# Patient Record
Sex: Female | Born: 1985
Health system: Southern US, Community
[De-identification: ages and names within clinical notes are randomized; demographics above are authoritative.]

## PROBLEM LIST (undated history)

## (undated) DIAGNOSIS — S83209A Unspecified tear of unspecified meniscus, current injury, unspecified knee, initial encounter: Secondary | ICD-10-CM

## (undated) DIAGNOSIS — I82409 Acute embolism and thrombosis of unspecified deep veins of unspecified lower extremity: Secondary | ICD-10-CM

## (undated) DIAGNOSIS — S83519A Sprain of anterior cruciate ligament of unspecified knee, initial encounter: Secondary | ICD-10-CM

## (undated) DIAGNOSIS — O24419 Gestational diabetes mellitus in pregnancy, unspecified control: Secondary | ICD-10-CM

## (undated) DIAGNOSIS — Z789 Other specified health status: Secondary | ICD-10-CM

## (undated) HISTORY — PX: ANTERIOR CRUCIATE LIGAMENT REPAIR: SHX115

## (undated) HISTORY — DX: Unspecified tear of unspecified meniscus, current injury, unspecified knee, initial encounter: S83.209A

## (undated) HISTORY — DX: Sprain of anterior cruciate ligament of unspecified knee, initial encounter: S83.519A

## (undated) HISTORY — PX: MENISCUS REPAIR: SHX5179

## (undated) HISTORY — PX: JOINT REPLACEMENT: SHX530

## (undated) HISTORY — DX: Acute embolism and thrombosis of unspecified deep veins of unspecified lower extremity: I82.409

---
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
## 2011-02-13 LAB — ABO/RH: RH Type: POSITIVE

## 2011-02-13 LAB — HIV ANTIBODY (ROUTINE TESTING W REFLEX): HIV: NONREACTIVE

## 2011-02-13 LAB — HEPATITIS B SURFACE ANTIGEN: Hepatitis B Surface Ag: NEGATIVE

## 2011-02-13 LAB — RUBELLA ANTIBODY, IGM: Rubella: IMMUNE

## 2011-02-13 LAB — RPR: RPR: NONREACTIVE

## 2011-07-07 ENCOUNTER — Encounter (HOSPITAL_COMMUNITY): Payer: Self-pay | Admitting: *Deleted

## 2011-07-07 ENCOUNTER — Inpatient Hospital Stay (HOSPITAL_COMMUNITY)
Admission: AD | Admit: 2011-07-07 | Discharge: 2011-07-09 | DRG: 775 | Disposition: A | Discharge status: Home / Self Care | Payer: Medicaid Other | Source: Ambulatory Visit | Attending: Obstetrics and Gynecology | Admitting: Obstetrics and Gynecology

## 2011-07-07 DIAGNOSIS — O99892 Other specified diseases and conditions complicating childbirth: Secondary | ICD-10-CM | POA: Diagnosis present

## 2011-07-07 DIAGNOSIS — Z3490 Encounter for supervision of normal pregnancy, unspecified, unspecified trimester: Secondary | ICD-10-CM

## 2011-07-07 DIAGNOSIS — Z22330 Carrier of Group B streptococcus: Secondary | ICD-10-CM

## 2011-07-07 HISTORY — DX: Other specified health status: Z78.9

## 2011-07-07 LAB — CBC
Hemoglobin: 12.3 g/dL (ref 12.0–15.0)
MCHC: 33.7 g/dL (ref 30.0–36.0)
Platelets: 201 10*3/uL (ref 150–400)
RBC: 4.08 MIL/uL (ref 3.87–5.11)

## 2011-07-07 LAB — POCT FERN TEST: Fern Test: POSITIVE

## 2011-07-07 LAB — RPR: RPR Ser Ql: NONREACTIVE

## 2011-07-07 MED ORDER — ZOLPIDEM TARTRATE 5 MG PO TABS: 5.0000 mg | ORAL_TABLET | Freq: Every evening | ORAL | Status: DC | PRN

## 2011-07-07 MED ORDER — BENZOCAINE-MENTHOL 20-0.5 % EX AERO
INHALATION_SPRAY | CUTANEOUS | Status: AC
  Administered 2011-07-07: 1 via TOPICAL
  Filled 2011-07-07: qty 56

## 2011-07-07 MED ORDER — FLEET ENEMA 7-19 GM/118ML RE ENEM: 1.0000 | ENEMA | RECTAL | Status: DC | PRN

## 2011-07-07 MED ORDER — TETANUS-DIPHTH-ACELL PERTUSSIS 5-2.5-18.5 LF-MCG/0.5 IM SUSP: 0.5000 mL | Freq: Once | INTRAMUSCULAR | Status: DC

## 2011-07-07 MED ORDER — CITRIC ACID-SODIUM CITRATE 334-500 MG/5ML PO SOLN: 30.0000 mL | ORAL | Status: DC | PRN

## 2011-07-07 MED ORDER — LACTATED RINGERS IV SOLN: 500.0000 mL | INTRAVENOUS | Status: DC | PRN

## 2011-07-07 MED ORDER — IBUPROFEN 600 MG PO TABS
600.0000 mg | ORAL_TABLET | Freq: Four times a day (QID) | ORAL | Status: DC
  Administered 2011-07-07 – 2011-07-09 (×10): 600 mg via ORAL
  Filled 2011-07-07 (×9): qty 1

## 2011-07-07 MED ORDER — OXYCODONE-ACETAMINOPHEN 5-325 MG PO TABS: 2.0000 | ORAL_TABLET | ORAL | Status: DC | PRN

## 2011-07-07 MED ORDER — LACTATED RINGERS IV SOLN
INTRAVENOUS | Status: DC
  Administered 2011-07-07: 03:00:00 via INTRAVENOUS

## 2011-07-07 MED ORDER — IBUPROFEN 600 MG PO TABS: 600.0000 mg | ORAL_TABLET | Freq: Four times a day (QID) | ORAL | Status: DC | PRN

## 2011-07-07 MED ORDER — SIMETHICONE 80 MG PO CHEW: 80.0000 mg | CHEWABLE_TABLET | ORAL | Status: DC | PRN

## 2011-07-07 MED ORDER — OXYTOCIN BOLUS FROM INFUSION: 500.0000 mL | Freq: Once | INTRAVENOUS | Status: DC

## 2011-07-07 MED ORDER — OXYCODONE-ACETAMINOPHEN 5-325 MG PO TABS
1.0000 | ORAL_TABLET | ORAL | Status: DC | PRN
  Administered 2011-07-08: 1 via ORAL
  Filled 2011-07-07: qty 1

## 2011-07-07 MED ORDER — ONDANSETRON HCL 4 MG PO TABS: 4.0000 mg | ORAL_TABLET | ORAL | Status: DC | PRN

## 2011-07-07 MED ORDER — ONDANSETRON HCL 4 MG/2ML IJ SOLN: 4.0000 mg | Freq: Four times a day (QID) | INTRAMUSCULAR | Status: DC | PRN

## 2011-07-07 MED ORDER — METHYLERGONOVINE MALEATE 0.2 MG/ML IJ SOLN: 0.2000 mg | INTRAMUSCULAR | Status: DC | PRN

## 2011-07-07 MED ORDER — METHYLERGONOVINE MALEATE 0.2 MG PO TABS: 0.2000 mg | ORAL_TABLET | ORAL | Status: DC | PRN

## 2011-07-07 MED ORDER — ONDANSETRON HCL 4 MG/2ML IJ SOLN: 4.0000 mg | INTRAMUSCULAR | Status: DC | PRN

## 2011-07-07 MED ORDER — WITCH HAZEL-GLYCERIN EX PADS: 1.0000 "application " | MEDICATED_PAD | CUTANEOUS | Status: DC | PRN

## 2011-07-07 MED ORDER — PRENATAL PLUS 27-1 MG PO TABS
1.0000 | ORAL_TABLET | Freq: Every day | ORAL | Status: DC
  Administered 2011-07-07 – 2011-07-09 (×3): 1 via ORAL
  Filled 2011-07-07 (×3): qty 1

## 2011-07-07 MED ORDER — DIBUCAINE 1 % RE OINT: 1.0000 "application " | TOPICAL_OINTMENT | RECTAL | Status: DC | PRN

## 2011-07-07 MED ORDER — DIPHENHYDRAMINE HCL 25 MG PO CAPS: 25.0000 mg | ORAL_CAPSULE | Freq: Four times a day (QID) | ORAL | Status: DC | PRN

## 2011-07-07 MED ORDER — NALBUPHINE SYRINGE 5 MG/0.5 ML
5.0000 mg | INJECTION | INTRAMUSCULAR | Status: DC | PRN
  Administered 2011-07-07: 5 mg via INTRAVENOUS
  Filled 2011-07-07: qty 0.5

## 2011-07-07 MED ORDER — OXYTOCIN 20 UNITS IN LACTATED RINGERS INFUSION - SIMPLE: 125.0000 mL/h | Freq: Once | INTRAVENOUS | Status: DC

## 2011-07-07 MED ORDER — SENNOSIDES-DOCUSATE SODIUM 8.6-50 MG PO TABS
2.0000 | ORAL_TABLET | Freq: Every day | ORAL | Status: DC
  Administered 2011-07-07 – 2011-07-08 (×2): 2 via ORAL

## 2011-07-07 MED ORDER — BENZOCAINE-MENTHOL 20-0.5 % EX AERO
1.0000 "application " | INHALATION_SPRAY | CUTANEOUS | Status: DC | PRN
  Administered 2011-07-07: 1 via TOPICAL

## 2011-07-07 MED ORDER — ACETAMINOPHEN 325 MG PO TABS: 650.0000 mg | ORAL_TABLET | ORAL | Status: DC | PRN

## 2011-07-07 MED ORDER — LANOLIN HYDROUS EX OINT: TOPICAL_OINTMENT | CUTANEOUS | Status: DC | PRN

## 2011-07-07 MED ORDER — LIDOCAINE HCL (PF) 1 % IJ SOLN: 30.0000 mL | INTRAMUSCULAR | Status: DC | PRN

## 2011-07-07 NOTE — Progress Notes (Signed)
  Delivery Note Pt progressed rapidly upon arrival to birthing suites.  At 4:59 AM a viable female was delivered via Vaginal, Spontaneous Delivery (Presentation: Right Occiput Anterior), precipitously.  No nuchal cord noted.  No difficulty with shoulders.   APGAR: 8, 9; weight 5 lb 3.4 oz (2365 g).   Placenta status:Intact , .  Cord: 3 vessels with the following complications: .  Cord pH: N/A  Anesthesia: None  Episiotomy: None Lacerations: Labial;1st degree Suture Repair: hemostatic and no repair necessary. Est. Blood Loss (mL):   Mom to postpartum.  Baby to nursery-stable.  Leoni Goodness O. 07/07/2011, 7:49 AM

## 2011-07-07 NOTE — Progress Notes (Signed)
Called and spoke with Elsie Ra CNM re no PNR available, no labs. System is down and unable to look up in spectrum at this time.

## 2011-07-07 NOTE — Progress Notes (Signed)
Elsie Ra CNM at bedside. EFM strip reviewed. Trans adj

## 2011-07-07 NOTE — H&P (Signed)
Kathleen Mason is a 24 y.o. female G2 P1,0,0,1 at 39.0wks presenting for regular uterine contractions which began earlier in the day and have increased in intensity and frequency. Denies ROM or bldg.  Active fetus.   Maternal Medical History:  Reason for admission: Reason for admission: contractions.  Contractions: Onset was 3-5 hours ago.   Frequency: regular.   Duration is approximately 60 seconds.   Perceived severity is strong.    Fetal activity: Perceived fetal activity is normal.   Last perceived fetal movement was within the past hour.    Prenatal complications: no prenatal complications Prenatal Complications - Diabetes: none.   Pt states she has been followed by the CNM service.  Pt's prenatal records are not available in Epic and no labs are avail in [facility] system at the present time. Pt denies any pregnancy complications or significant OB hx.  Pt states her GBS status is negative.   OB History    Grav Para Term Preterm Abortions TAB SAB Ect Mult Living   2 1 1       1      Past Medical History  Diagnosis Date  . No pertinent past medical history    Past Surgical History  Procedure Date  . Gallbladder surgery 2004   Family History: family history includes Diabetes in her paternal grandmother. Social History:  reports that she has never smoked. She does not have any smokeless tobacco history on file. She reports that she drinks alcohol. She reports that she does not use illicit drugs.  Review of Systems  Constitutional: Negative.   HENT: Negative.   Eyes: Negative.   Respiratory: Negative.   Cardiovascular: Negative.   Gastrointestinal: Negative.   Genitourinary: Negative.   Musculoskeletal: Negative.   Skin: Negative.   Neurological: Negative.   Endo/Heme/Allergies: Negative.   Psychiatric/Behavioral: Negative.       Blood pressure 133/84, pulse 76, temperature 98.4 F (36.9 C), temperature source Oral, resp. rate 20, height 5\' 5"  (1.651 m), weight 88.055 kg  (194 lb 2 oz). Maternal Exam:  Uterine Assessment: Contraction strength is firm.  Contraction duration is 70 seconds. Contraction frequency is regular.   Abdomen: Patient reports no abdominal tenderness. Fundal height is 38.   Estimated fetal weight is 6#.   Fetal presentation: vertex  Introitus: Normal vulva. Normal vagina.  Ferning test: not done.  Nitrazine test: not done. Amniotic fluid character: not assessed.  Pelvis: adequate for delivery.   Cervix: Cervix evaluated by digital exam.     Fetal Exam Fetal Monitor Review: Mode: ultrasound.   Baseline rate: 140.  Variability: minimal (<5 bpm).   Pattern: accelerations present and no accelerations.    Fetal State Assessment: Category II - tracings are indeterminate.    SVE 3cm/70%/-1  Physical Exam  Constitutional: She is oriented to person, place, and time. She appears well-developed and well-nourished.  HENT:  Head: Normocephalic and atraumatic.  Right Ear: External ear normal.  Left Ear: External ear normal.  Nose: Nose normal.  Eyes: Conjunctivae are normal. Pupils are equal, round, and reactive to light.  Neck: Normal range of motion. Neck supple. No thyromegaly present.  Cardiovascular: Normal rate, regular rhythm and intact distal pulses.   Respiratory: Effort normal and breath sounds normal.  GI: Soft. Bowel sounds are normal.  Genitourinary: Vagina normal and uterus normal.  Musculoskeletal: Normal range of motion.  Neurological: She is alert and oriented to person, place, and time. She has normal reflexes.  Skin: Skin is warm and dry.  Psychiatric: She has a normal mood and affect. Her behavior is normal. Judgment and thought content normal.    Prenatal labs: Unavailable at the present time ABO, Rh:   Antibody:   Rubella:   RPR:    HBsAg:    HIV:    GBS:     Assessment/Plan: IUP at 39wks. Early labor    Josefine Fuhr O. 07/07/2011, 2:27 AM

## 2011-07-07 NOTE — Progress Notes (Signed)
Pt is progressing quickly, getting ready for delivery

## 2011-07-07 NOTE — ED Notes (Signed)
Elsie Ra CNM called and will put in order for pain med for pt.

## 2011-07-07 NOTE — Progress Notes (Addendum)
Kathleen Mason is a 25 y.o. G2P2002 at [redacted]w[redacted]d. Subjective: Called to see pt due to variable decels on FHR tracing.  Also pt has had SROM in MAU of small amt light mec fluid while awaiting transfer to birthing suites.  Pt reports increased intensity of UCs.  Desires epidural placement.    Objective: BP 117/70  Pulse 71  Temp(Src) 98 F (36.7 C) (Oral)  Resp 20  Ht 5\' 5"  (1.651 m)  Wt 88.055 kg (194 lb 2 oz)  BMI 32.30 kg/m2  SpO2 100%  Breastfeeding? Unknown      FHT:  FHR: 130 bpm, variability: minimal ,  accelerations:  Present,  decelerations:  Present variable decels with UCs.   UC:   regular, every 2-3 minutes SVE:   4cm/90%/-1 per RN.   Labs: Lab Results  Component Value Date   WBC 14.1* 07/07/2011   HGB 12.3 07/07/2011   HCT 36.5 07/07/2011   MCV 89.5 07/07/2011   PLT 201 07/07/2011    Assessment / Plan: Spontaneous labor, progressing normally Meconium stained amniotic fluid  Labor: Progressing normally Preeclampsia:  no signs or symptoms of toxicity Fetal Wellbeing:  Category II Pain Control:  Labor support without medications I/D:  n/a Anticipated MOD:  NSVD and    Await bed in birthing suites.  Continue to observe FHR closely.  Dr. Stefano Mason updated.    Kathleen Mason O. 07/07/2011, 7:42 AM

## 2011-07-07 NOTE — ED Notes (Signed)
Conni Elliot CNM notified of pt's status. Aware of minimal variability, variables with ctxs, pt to L Lat. And O2 on at 10l/min. To reck cervix before giving Nubain.

## 2011-07-07 NOTE — Progress Notes (Signed)
Pt states, " I've had contractions off and on all day, but more regular since noon. They are every 4-5 minutes now."

## 2011-07-07 NOTE — Progress Notes (Signed)
SVD of viable baby boy

## 2011-07-07 NOTE — ED Notes (Signed)
Report called to [facility] in [place].

## 2011-07-08 LAB — CBC
HCT: 32.8 % — ABNORMAL LOW (ref 36.0–46.0)
Hemoglobin: 10.7 g/dL — ABNORMAL LOW (ref 12.0–15.0)
MCV: 90.6 fL (ref 78.0–100.0)
Platelets: 180 10*3/uL (ref 150–400)
RDW: 14.1 % (ref 11.5–15.5)
WBC: 11.1 10*3/uL — ABNORMAL HIGH (ref 4.0–10.5)

## 2011-07-08 NOTE — Progress Notes (Signed)
Post Partum Day 1 Subjective: no complaints.  Doing well.  Plans Micronor for contraception.  Breastfeeding.  Objective: Blood pressure 103/69, pulse 66, temperature 98 F (36.7 C), temperature source Oral, resp. rate 18, height 5\' 5"  (1.651 m), weight 88.055 kg (194 lb 2 oz), SpO2 98.00%, unknown if currently breastfeeding.  Physical Exam:  General: alert Lochia: appropriate Uterine Fundus: firm Incision: healing well DVT Evaluation: No evidence of DVT seen on physical exam. Negative Homan's sign.   Basename 07/08/11 0516 07/07/11 0259  HGB 10.7* 12.3  HCT 32.8* 36.5    Assessment/Plan: Plan for discharge tomorrow Will Rx Micronor.   LOS: 1 day   Nigel Bridgeman 07/08/2011, 12:58 PM

## 2011-07-08 NOTE — Progress Notes (Signed)
UR chart review completed.  

## 2011-07-09 DIAGNOSIS — Z3490 Encounter for supervision of normal pregnancy, unspecified, unspecified trimester: Secondary | ICD-10-CM

## 2011-07-09 MED ORDER — NORETHINDRONE 0.35 MG PO TABS: 1.0000 | ORAL_TABLET | Freq: Every day | ORAL | Status: DC

## 2011-07-09 MED ORDER — OXYCODONE-ACETAMINOPHEN 5-325 MG PO TABS: 1.0000 | ORAL_TABLET | ORAL | Status: AC | PRN

## 2011-07-09 MED ORDER — IBUPROFEN 600 MG PO TABS: 600.0000 mg | ORAL_TABLET | Freq: Four times a day (QID) | ORAL | Status: AC | PRN

## 2011-07-09 NOTE — Discharge Summary (Signed)
   Obstetric Discharge Summary Reason for Admission: onset of labor Prenatal Procedures: ultrasound Intrapartum Procedures: spontaneous vaginal delivery Postpartum Procedures: none Complications-Operative and Postpartum: 1st degree perineal laceration  Temp:  [97.4 F (36.3 C)-97.8 F (36.6 C)] 97.7 F (36.5 C) (11/06 0540) Pulse Rate:  [62-71] 62  (11/06 0540) Resp:  [18] 18  (11/06 0540) BP: (112-119)/(76-81) 112/76 mmHg (11/06 0540) Hemoglobin  Date Value Range Status  07/08/2011 10.7* 12.0-15.0 (g/dL) Final     HCT  Date Value Range Status  07/08/2011 32.8* 36.0-46.0 (%) Final    Hospital Course:  [facility] Course: Admitted in labor 07/07/11. Positive GBS. Progressed to fully dilated quickly, without pain medications.  Delivery was performed by Elsie Ra, CNM, without difficulty. Patient and baby tolerated the procedure well, with a 1st degree laceration noted. Infant to FTN. Mother and infant then had an uncomplicated postpartum course, with breast feeding going well. Mom's physical exam was WNL, and she was discharged home in stable condition. Contraception plan was Micronor.  She received adequate benefit from po pain medications.  Other issues:  None      Discharge Diagnoses: Term Pregnancy-delivered  Discharge Information: Date: 07/09/2011 Activity: Per [facility] handout Diet: routine Medications:  Medication List  As of 07/09/2011  9:01 AM   START taking these medications         norethindrone 0.35 MG tablet   Commonly known as: MICRONOR,CAMILA,ERRIN   Take 1 tablet (0.35 mg total) by mouth daily.      oxyCODONE-acetaminophen 5-325 MG per tablet   Commonly known as: PERCOCET   Take 1-2 tablets by mouth every 3 (three) hours as needed (moderate - severe pain).         CHANGE how you take these medications         ibuprofen 600 MG tablet   Commonly known as: ADVIL,MOTRIN   Take 1 tablet (600 mg total) by mouth every 6 (six) hours as needed for pain.   What  changed: - medication strength - dose - reasons to take the med - doctor's instructions         CONTINUE taking these medications         prenatal vitamin w/FE, FA 27-1 MG Tabs          Where to get your medications    These are the prescriptions that you need to pick up.   You may get these medications from any pharmacy.         ibuprofen 600 MG tablet   norethindrone 0.35 MG tablet   oxyCODONE-acetaminophen 5-325 MG per tablet           Condition: stable Instructions: refer to practice specific booklet Discharge to: home Follow-up Information    Follow up with [facility] in 6 weeks.         Newborn Data: Live born  Information for the patient's newborn:  Mariann, Palo [[id number]]  female ; APGAR 8/9 , ; weight 5+3;  Home with mother.  Cataleia Gade 07/09/2011, 9:01 AM

## 2014-07-04 ENCOUNTER — Encounter (HOSPITAL_COMMUNITY): Payer: Self-pay | Admitting: *Deleted

## 2015-06-22 ENCOUNTER — Other Ambulatory Visit: Payer: Self-pay | Admitting: Obstetrics and Gynecology

## 2015-06-22 DIAGNOSIS — N6312 Unspecified lump in the right breast, upper inner quadrant: Secondary | ICD-10-CM

## 2015-07-03 ENCOUNTER — Ambulatory Visit
Admission: RE | Admit: 2015-07-03 | Discharge: 2015-07-03 | Disposition: A | Discharge status: Home / Self Care | Payer: BLUE CROSS/BLUE SHIELD | Source: Ambulatory Visit | Attending: Obstetrics and Gynecology | Admitting: Obstetrics and Gynecology

## 2015-07-03 ENCOUNTER — Other Ambulatory Visit: Payer: Self-pay | Admitting: Obstetrics and Gynecology

## 2015-07-03 DIAGNOSIS — N6312 Unspecified lump in the right breast, upper inner quadrant: Secondary | ICD-10-CM

## 2015-08-10 ENCOUNTER — Other Ambulatory Visit: Payer: Self-pay | Admitting: Orthopedic Surgery

## 2015-08-10 DIAGNOSIS — M25532 Pain in left wrist: Secondary | ICD-10-CM

## 2015-08-24 ENCOUNTER — Other Ambulatory Visit: Payer: BLUE CROSS/BLUE SHIELD

## 2015-09-07 ENCOUNTER — Ambulatory Visit
Admission: RE | Admit: 2015-09-07 | Discharge: 2015-09-07 | Disposition: A | Discharge status: Home / Self Care | Payer: 59 | Source: Ambulatory Visit | Attending: Orthopedic Surgery | Admitting: Orthopedic Surgery

## 2015-09-07 ENCOUNTER — Ambulatory Visit
Admission: RE | Admit: 2015-09-07 | Discharge: 2015-09-07 | Disposition: A | Discharge status: Home / Self Care | Payer: BLUE CROSS/BLUE SHIELD | Source: Ambulatory Visit | Attending: Orthopedic Surgery | Admitting: Orthopedic Surgery

## 2015-09-07 DIAGNOSIS — M25532 Pain in left wrist: Secondary | ICD-10-CM

## 2015-09-07 MED ORDER — IOHEXOL 180 MG/ML  SOLN
1.5000 mL | Freq: Once | INTRAMUSCULAR | Status: AC | PRN
  Administered 2015-09-07: 1.5 mL via INTRA_ARTICULAR

## 2016-05-27 ENCOUNTER — Encounter: Payer: Self-pay | Admitting: Obstetrics and Gynecology

## 2016-05-27 ENCOUNTER — Ambulatory Visit (INDEPENDENT_AMBULATORY_CARE_PROVIDER_SITE_OTHER): Payer: 59 | Admitting: Obstetrics and Gynecology

## 2016-05-27 VITALS — BP 102/60 | HR 60 | Resp 13 | Ht 65.0 in | Wt 187.0 lb

## 2016-05-27 DIAGNOSIS — E663 Overweight: Secondary | ICD-10-CM | POA: Diagnosis not present

## 2016-05-27 DIAGNOSIS — Z3009 Encounter for other general counseling and advice on contraception: Secondary | ICD-10-CM

## 2016-05-27 DIAGNOSIS — Z124 Encounter for screening for malignant neoplasm of cervix: Secondary | ICD-10-CM

## 2016-05-27 DIAGNOSIS — Z0000 Encounter for general adult medical examination without abnormal findings: Secondary | ICD-10-CM | POA: Diagnosis not present

## 2016-05-27 DIAGNOSIS — Z01419 Encounter for gynecological examination (general) (routine) without abnormal findings: Secondary | ICD-10-CM | POA: Diagnosis not present

## 2016-05-27 LAB — COMPREHENSIVE METABOLIC PANEL
ALBUMIN: 4.2 g/dL (ref 3.6–5.1)
ALT: 30 U/L — ABNORMAL HIGH (ref 6–29)
AST: 16 U/L (ref 10–30)
Alkaline Phosphatase: 47 U/L (ref 33–115)
BILIRUBIN TOTAL: 0.5 mg/dL (ref 0.2–1.2)
BUN: 13 mg/dL (ref 7–25)
CALCIUM: 9.1 mg/dL (ref 8.6–10.2)
CHLORIDE: 104 mmol/L (ref 98–110)
CO2: 22 mmol/L (ref 20–31)
Creat: 0.73 mg/dL (ref 0.50–1.10)
Glucose, Bld: 84 mg/dL (ref 65–99)
Potassium: 4 mmol/L (ref 3.5–5.3)
Sodium: 135 mmol/L (ref 135–146)
Total Protein: 7.3 g/dL (ref 6.1–8.1)

## 2016-05-27 LAB — LIPID PANEL
CHOLESTEROL: 120 mg/dL — AB (ref 125–200)
HDL: 40 mg/dL — AB (ref >=46)
LDL CALC: 53 mg/dL (ref <130)
TRIGLYCERIDES: 134 mg/dL (ref <150)
Total CHOL/HDL Ratio: 3 Ratio (ref <=5.0)
VLDL: 27 mg/dL (ref <30)

## 2016-05-27 MED ORDER — LEVONORGESTREL-ETHINYL ESTRAD 0.1-20 MG-MCG PO TABS: 1.0000 | ORAL_TABLET | Freq: Every day | ORAL | 3 refills | Status: DC

## 2016-05-27 NOTE — Progress Notes (Signed)
30 y.o. [id number] MarriedHispanicF here for annual exam.  She is on the mini-pill, was called in as an error, should have been Levora.  Period Cycle (Days): 28 Period Duration (Days): 3-4 days  Period Pattern: Regular Menstrual Flow: Moderate Menstrual Control: Tampon Dysmenorrhea: (!) Moderate Dysmenorrhea Symptoms: Cramping  Saturates a regular tampon in 8 hours. No BTB. Cramps are mostly tolerable, helped with aleve. No dyspareunia.  She exercises for 30-60 minutes 3-4 days a week. She is trying to eat healthy.   Patient's last menstrual period was 05/16/2016 (approximate).          Sexually active: Yes.    The current method of family planning is OCP.    Exercising: Yes.    soccer,weight lifting Smoker:  no  Health Maintenance: Pap:  06/2015 WNL History of abnormal Pap:  no MMG:  07-03-15 WNL  Colonoscopy:  Never BMD:   Never TDaP:  01/2016  Gardasil: no   reports that she has never smoked. She has never used smokeless tobacco. She reports that she drinks about 2.4 - 3.0 oz of alcohol per week . She reports that she does not use drugs.Works as a Museum/gallery conservator. Two sons, 4 and 8.   Past Medical History:  Diagnosis Date  . No pertinent past medical history     Past Surgical History:  Procedure Laterality Date  . GALLBLADDER SURGERY  2004    Current Outpatient Prescriptions  Medication Sig Dispense Refill  . norethindrone (ORTHO MICRONOR) 0.35 MG tablet Take 1 tablet (0.35 mg total) by mouth daily. 1 Package 11   No current facility-administered medications for this visit.     Family History  Problem Relation Age of Onset  . Diabetes Paternal Grandmother     Review of Systems  Constitutional: Negative.   HENT: Negative.   Eyes: Negative.   Respiratory: Negative.   Cardiovascular: Negative.   Gastrointestinal: Negative.   Endocrine: Negative.   Genitourinary: Negative.   Musculoskeletal: Negative.   Skin: Negative.   Allergic/Immunologic: Negative.    Neurological: Negative.   Psychiatric/Behavioral: Negative.   Occasionally feels she can't get a deep breath.  Exam:   BP 102/60 (BP Location: Right Arm, Patient Position: Sitting, Cuff Size: Normal)   Pulse 60   Resp 13   Ht 5\' 5"  (1.651 m)   Wt 187 lb (84.8 kg)   LMP 05/16/2016 (Approximate)   Breastfeeding? No   BMI 31.12 kg/m   Weight change: @WEIGHTCHANGE @ Height:   Height: 5\' 5"  (165.1 cm)  Ht Readings from Last 3 Encounters:  05/27/16 5\' 5"  (1.651 m)  07/07/11 5\' 5"  (1.651 m)    General appearance: alert, cooperative and appears stated age Head: Normocephalic, without obvious abnormality, atraumatic Neck: no adenopathy, supple, symmetrical, trachea midline and thyroid normal to inspection and palpation Lungs: clear to auscultation bilaterally Breasts: normal appearance, no masses or tenderness Heart: regular rate and rhythm Abdomen: soft, non-tender; bowel sounds normal; no masses,  no organomegaly Extremities: extremities normal, atraumatic, no cyanosis or edema Skin: Skin color, texture, turgor normal. No rashes or lesions Lymph nodes: Cervical, supraclavicular, and axillary nodes normal. No abnormal inguinal nodes palpated Neurologic: Grossly normal   Pelvic: External genitalia:  no lesions              Urethra:  normal appearing urethra with no masses, tenderness or lesions              Bartholins and Skenes: normal  Vagina: normal appearing vagina with normal color and discharge, no lesions              Cervix: no lesions               Bimanual Exam:  Uterus:  normal size, contour, position, consistency, mobility, non-tender              Adnexa: no mass, fullness, tenderness               Rectovaginal: Confirms               Anus:  normal sphincter tone, no lesions  Chaperone was present for exam.  A:  Well Woman with normal exam  Contraception, will go back on combination OCP's, no contraindications, risks reviewed  Can't loose weight even  with exercise and diet  Occasionally episodes of feeling like she can't get a deep breath, normal exam, will f/u with primary (names given)    P:   Pap with hpv  Screening labs  TSH  Restart on OCP's  Discussed breast self exam  Discussed calcium and vit D intake  Discussed weight watchers and exercise

## 2016-05-27 NOTE — Patient Instructions (Addendum)
Breast Self-Awareness Practicing breast self-awareness may pick up problems early, prevent significant medical complications, and possibly save your life. By practicing breast self-awareness, you can become familiar with how your breasts look and feel and if your breasts are changing. This allows you to notice changes early. It can also offer you some reassurance that your breast health is good. One way to learn what is normal for your breasts and whether your breasts are changing is to do a breast self-exam. If you find a lump or something that was not present in the past, it is best to contact your caregiver right away. Other findings that should be evaluated by your caregiver include nipple discharge, especially if it is bloody; skin changes or reddening; areas where the skin seems to be pulled in (retracted); or new lumps and bumps. Breast pain is seldom associated with cancer (malignancy), but should also be evaluated by a caregiver. HOW TO PERFORM A BREAST SELF-EXAM The best time to examine your breasts is 5-7 days after your menstrual period is over. During menstruation, the breasts are lumpier, and it may be more difficult to pick up changes. If you do not menstruate, have reached menopause, or had your uterus removed (hysterectomy), you should examine your breasts at regular intervals, such as monthly. If you are breastfeeding, examine your breasts after a feeding or after using a breast pump. Breast implants do not decrease the risk for lumps or tumors, so continue to perform breast self-exams as recommended. Talk to your caregiver about how to determine the difference between the implant and breast tissue. Also, talk about the amount of pressure you should use during the exam. Over time, you will become more familiar with the variations of your breasts and more comfortable with the exam. A breast self-exam requires you to remove all your clothes above the waist. 1. Look at your breasts and nipples.  Stand in front of a mirror in a room with good lighting. With your hands on your hips, push your hands firmly downward. Look for a difference in shape, contour, and size from one breast to the other (asymmetry). Asymmetry includes puckers, dips, or bumps. Also, look for skin changes, such as reddened or scaly areas on the breasts. Look for nipple changes, such as discharge, dimpling, repositioning, or redness. 2. Carefully feel your breasts. This is best done either in the shower or tub while using soapy water or when flat on your back. Place the arm (on the side of the breast you are examining) above your head. Use the pads (not the fingertips) of your three middle fingers on your opposite hand to feel your breasts. Start in the underarm area and use  inch (2 cm) overlapping circles to feel your breast. Use 3 different levels of pressure (light, medium, and firm pressure) at each circle before moving to the next circle. The light pressure is needed to feel the tissue closest to the skin. The medium pressure will help to feel breast tissue a little deeper, while the firm pressure is needed to feel the tissue close to the ribs. Continue the overlapping circles, moving downward over the breast until you feel your ribs below your breast. Then, move one finger-width towards the center of the body. Continue to use the  inch (2 cm) overlapping circles to feel your breast as you move slowly up toward the collar bone (clavicle) near the base of the neck. Continue the up and down exam using all 3 pressures until you reach the   middle of the chest. Do this with each breast, carefully feeling for lumps or changes. 3.  Keep a written record with breast changes or normal findings for each breast. By writing this information down, you do not need to depend only on memory for size, tenderness, or location. Write down where you are in your menstrual cycle, if you are still menstruating. Breast tissue can have some lumps or  thick tissue. However, see your caregiver if you find anything that concerns you.  SEEK MEDICAL CARE IF:  You see a change in shape, contour, or size of your breasts or nipples.   You see skin changes, such as reddened or scaly areas on the breasts or nipples.   You have an unusual discharge from your nipples.   You feel a new lump or unusually thick areas.    This information is not intended to replace advice given to you by your health care provider. Make sure you discuss any questions you have with your health care provider.   Document Released: 08/19/2005 Document Revised: 08/05/2012 Document Reviewed: 12/04/2011 Elsevier Interactive Patient Education 2016 [employer]. Oral Contraception Information Oral contraceptive pills (OCPs) are medicines taken to prevent pregnancy. OCPs work by preventing the ovaries from releasing eggs. The hormones in OCPs also cause the cervical mucus to thicken, preventing the sperm from entering the uterus. The hormones also cause the uterine lining to become thin, not allowing a fertilized egg to attach to the inside of the uterus. OCPs are highly effective when taken exactly as prescribed. However, OCPs do not prevent sexually transmitted diseases (STDs). Safe sex practices, such as using condoms along with the pill, can help prevent STDs.  Before taking the pill, you may have a physical exam and Pap test. Your health care provider may order blood tests. The health care provider will make sure you are a good candidate for oral contraception. Discuss with your health care provider the possible side effects of the OCP you may be prescribed. When starting an OCP, it can take 2 to 3 months for the body to adjust to the changes in hormone levels in your body.  TYPES OF ORAL CONTRACEPTION  The combination pill--This pill contains estrogen and progestin (synthetic progesterone) hormones. The combination pill comes in 21-day, 28-day, or 91-day packs. Some types  of combination pills are meant to be taken continuously (365-day pills). With 21-day packs, you do not take pills for 7 days after the last pill. With 28-day packs, the pill is taken every day. The last 7 pills are without hormones. Certain types of pills have more than 21 hormone-containing pills. With 91-day packs, the first 84 pills contain both hormones, and the last 7 pills contain no hormones or contain estrogen only.  The minipill--This pill contains the progesterone hormone only. The pill is taken every day continuously. It is very important to take the pill at the same time each day. The minipill comes in packs of 28 pills. All 28 pills contain the hormone.  ADVANTAGES OF ORAL CONTRACEPTIVE PILLS  Decreases premenstrual symptoms.   Treats menstrual period cramps.   Regulates the menstrual cycle.   Decreases a heavy menstrual flow.   May treatacne, depending on the type of pill.   Treats abnormal uterine bleeding.   Treats polycystic ovarian syndrome.   Treats endometriosis.   Can be used as emergency contraception.  THINGS THAT CAN MAKE ORAL CONTRACEPTIVE PILLS LESS EFFECTIVE OCPs can be less effective if:   You forget to take  the pill at the same time every day.   You have a stomach or intestinal disease that lessens the absorption of the pill.   You take OCPs with other medicines that make OCPs less effective, such as antibiotics, certain HIV medicines, and some seizure medicines.   You take expired OCPs.   You forget to restart the pill on day 7, when using the packs of 21 pills.  RISKS ASSOCIATED WITH ORAL CONTRACEPTIVE PILLS  Oral contraceptive pills can sometimes cause side effects, such as:  Headache.  Nausea.  Breast tenderness.  Irregular bleeding or spotting. Combination pills are also associated with a small increased risk of:  Blood clots.  Heart attack.  Stroke.   This information is not intended to replace advice given to you  by your health care provider. Make sure you discuss any questions you have with your health care provider.   Document Released: 11/09/2002 Document Revised: 06/09/2013 Document Reviewed: 02/07/2013 Elsevier Interactive Patient Education 2016 [employer]  EXERCISE AND DIET:  We recommended that you start or continue a regular exercise program for good health. Regular exercise means any activity that makes your heart beat faster and makes you sweat.  We recommend exercising at least 30 minutes per day at least 3 days a week, preferably 4 or 5.  We also recommend a diet low in fat and sugar.  Inactivity, poor dietary choices and obesity can cause diabetes, heart attack, stroke, and kidney damage, among others.    ALCOHOL AND SMOKING:  Women should limit their alcohol intake to no more than 7 drinks/beers/glasses of wine (combined, not each!) per week. Moderation of alcohol intake to this level decreases your risk of breast cancer and liver damage. And of course, no recreational drugs are part of a healthy lifestyle.  And absolutely no smoking or even second hand smoke. Most people know smoking can cause heart and lung diseases, but did you know it also contributes to weakening of your bones? Aging of your skin?  Yellowing of your teeth and nails?  CALCIUM AND VITAMIN D:  Adequate intake of calcium and Vitamin D are recommended.  The recommendations for exact amounts of these supplements seem to change often, but generally speaking 600 mg of calcium (either carbonate or citrate) and 800 units of Vitamin D per day seems prudent. Certain women may benefit from higher intake of Vitamin D.  If you are among these women, your doctor will have told you during your visit.    PAP SMEARS:  Pap smears, to check for cervical cancer or precancers,  have traditionally been done yearly, although recent scientific advances have shown that most women can have pap smears less often.  However, every woman still should have  a physical exam from her gynecologist every year. It will include a breast check, inspection of the vulva and vagina to check for abnormal growths or skin changes, a visual exam of the cervix, and then an exam to evaluate the size and shape of the uterus and ovaries.  And after 32 years of age, a rectal exam is indicated to check for rectal cancers. We will also provide age appropriate advice regarding health maintenance, like when you should have certain vaccines, screening for sexually transmitted diseases, bone density testing, colonoscopy, mammograms, etc.   MAMMOGRAMS:  All women over 59 years old should have a yearly mammogram. Many facilities now offer a "3D" mammogram, which may cost around $50 extra out of pocket. If possible,  we recommend you  accept the option to have the 3D mammogram performed.  It both reduces the number of women who will be called back for extra views which then turn out to be normal, and it is better than the routine mammogram at detecting truly abnormal areas.    COLONOSCOPY:  Colonoscopy to screen for colon cancer is recommended for all women at age 32.  We know, you hate the idea of the prep.  We agree, BUT, having colon cancer and not knowing it is worse!!  Colon cancer so often starts as a polyp that can be seen and removed at colonscopy, which can quite literally save your life!  And if your first colonoscopy is normal and you have no family history of colon cancer, most women don't have to have it again for 10 years.  Once every ten years, you can do something that may end up saving your life, right?  We will be happy to help you get it scheduled when you are ready.  Be sure to check your insurance coverage so you understand how much it will cost.  It may be covered as a preventative service at no cost, but you should check your particular policy.

## 2016-05-28 ENCOUNTER — Telehealth: Payer: Self-pay | Admitting: *Deleted

## 2016-05-28 LAB — CBC
HEMATOCRIT: 40.2 % (ref 35.0–45.0)
HEMOGLOBIN: 12.9 g/dL (ref 11.7–15.5)
MCH: 29.9 pg (ref 27.0–33.0)
MCHC: 32.1 g/dL (ref 32.0–36.0)
MCV: 93.1 fL (ref 80.0–100.0)
MPV: 10.8 fL (ref 7.5–12.5)
Platelets: 286 10*3/uL (ref 140–400)
RBC: 4.32 MIL/uL (ref 3.80–5.10)
RDW: 13.8 % (ref 11.0–15.0)
WBC: 8.8 10*3/uL (ref 3.8–10.8)

## 2016-05-28 LAB — TSH: TSH: 3.54 mIU/L

## 2016-05-28 LAB — VITAMIN D 25 HYDROXY (VIT D DEFICIENCY, FRACTURES): Vit D, 25-Hydroxy: 24 ng/mL — ABNORMAL LOW (ref 30–100)

## 2016-05-28 NOTE — Telephone Encounter (Signed)
-----   Message from Romualdo BolkJill Evelyn Jertson, MD sent at 05/28/2016 10:07 AM EDT ----- Please let the patient know that her vit D was low. She should start on vit D 3, 1,000 IU a day (long term). One of her LFT's was just above normal. Her good cholesterol (HCL) is low, this increases her risk of heart disease down the line. She should eat a diet low in saturated fats (I think she is already trying to do this) and try and increase her exercise to 60 minutes 5 days a week.  She is establishing care with a primary and should have her LFT's checked again there. Please ask her to call us once she sets up an appointment and we can send a copy of her labs.  It's important her LFT's get followed. If her LFT's were to go up combination OCP's wouldn't be the best option for her. Please keep her chart so we don't loose track of her. I want to make sure she follow's up.

## 2016-05-28 NOTE — Telephone Encounter (Signed)
Left message to call regarding lab results -eh 

## 2016-05-29 LAB — IPS PAP TEST WITH HPV

## 2016-05-29 NOTE — Telephone Encounter (Signed)
Left another message for patient to return call-eh 

## 2016-05-29 NOTE — Telephone Encounter (Signed)
Patient is returning a call to Elaine. °

## 2016-05-30 ENCOUNTER — Telehealth: Payer: Self-pay | Admitting: *Deleted

## 2016-05-30 NOTE — Telephone Encounter (Signed)
Left message to call regarding PAP results -eh

## 2016-05-30 NOTE — Telephone Encounter (Signed)
Spoke with patient and gave results. See results note -eh

## 2016-05-30 NOTE — Telephone Encounter (Signed)
-----   Message from Romualdo BolkJill Evelyn Jertson, MD sent at 05/29/2016  2:51 PM EDT ----- CBC is normal Pap normal with negative HPV, + for BV. If symptomatic only, would treat for Bv Oral: Flagyl 500 mg BID x 7 days, or Vaginal: Metrogel, 1 applicator per vagina q day x 5 days. No ETOH on flagyl 02 recall

## 2016-05-30 NOTE — Telephone Encounter (Signed)
Patient returning call.

## 2016-07-24 ENCOUNTER — Encounter: Payer: Self-pay | Admitting: *Deleted

## 2017-06-05 ENCOUNTER — Encounter: Payer: Self-pay | Admitting: Obstetrics and Gynecology

## 2017-06-05 ENCOUNTER — Ambulatory Visit (INDEPENDENT_AMBULATORY_CARE_PROVIDER_SITE_OTHER): Payer: 59 | Admitting: Obstetrics and Gynecology

## 2017-06-05 VITALS — BP 102/60 | HR 60 | Resp 14 | Ht 65.0 in | Wt 205.0 lb

## 2017-06-05 DIAGNOSIS — E559 Vitamin D deficiency, unspecified: Secondary | ICD-10-CM | POA: Insufficient documentation

## 2017-06-05 DIAGNOSIS — Z01419 Encounter for gynecological examination (general) (routine) without abnormal findings: Secondary | ICD-10-CM | POA: Diagnosis not present

## 2017-06-05 DIAGNOSIS — Z3041 Encounter for surveillance of contraceptive pills: Secondary | ICD-10-CM

## 2017-06-05 DIAGNOSIS — R635 Abnormal weight gain: Secondary | ICD-10-CM | POA: Diagnosis not present

## 2017-06-05 DIAGNOSIS — R748 Abnormal levels of other serum enzymes: Secondary | ICD-10-CM

## 2017-06-05 DIAGNOSIS — Z0000 Encounter for general adult medical examination without abnormal findings: Secondary | ICD-10-CM

## 2017-06-05 DIAGNOSIS — E663 Overweight: Secondary | ICD-10-CM

## 2017-06-05 DIAGNOSIS — R945 Abnormal results of liver function studies: Secondary | ICD-10-CM | POA: Diagnosis not present

## 2017-06-05 DIAGNOSIS — R7989 Other specified abnormal findings of blood chemistry: Secondary | ICD-10-CM

## 2017-06-05 MED ORDER — LEVONORGESTREL-ETHINYL ESTRAD 0.1-20 MG-MCG PO TABS: 1.0000 | ORAL_TABLET | Freq: Every day | ORAL | 3 refills | Status: DC

## 2017-06-05 NOTE — Patient Instructions (Signed)
EXERCISE AND DIET:  We recommended that you start or continue a regular exercise program for good health. Regular exercise means any activity that makes your heart beat faster and makes you sweat.  We recommend exercising at least 30 minutes per day at least 3 days a week, preferably 4 or 5.  We also recommend a diet low in fat and sugar.  Inactivity, poor dietary choices and obesity can cause diabetes, heart attack, stroke, and kidney damage, among others.    ALCOHOL AND SMOKING:  Women should limit their alcohol intake to no more than 7 drinks/beers/glasses of wine (combined, not each!) per week. Moderation of alcohol intake to this level decreases your risk of breast cancer and liver damage. And of course, no recreational drugs are part of a healthy lifestyle.  And absolutely no smoking or even second hand smoke. Most people know smoking can cause heart and lung diseases, but did you know it also contributes to weakening of your bones? Aging of your skin?  Yellowing of your teeth and nails?  CALCIUM AND VITAMIN D:  Adequate intake of calcium and Vitamin D are recommended.  The recommendations for exact amounts of these supplements seem to change often, but generally speaking 600 mg of calcium (either carbonate or citrate) and 800 units of Vitamin D per day seems prudent. Certain women may benefit from higher intake of Vitamin D.  If you are among these women, your doctor will have told you during your visit.    PAP SMEARS:  Pap smears, to check for cervical cancer or precancers,  have traditionally been done yearly, although recent scientific advances have shown that most women can have pap smears less often.  However, every woman still should have a physical exam from her gynecologist every year. It will include a breast check, inspection of the vulva and vagina to check for abnormal growths or skin changes, a visual exam of the cervix, and then an exam to evaluate the size and shape of the uterus and  ovaries.  And after 33 years of age, a rectal exam is indicated to check for rectal cancers. We will also provide age appropriate advice regarding health maintenance, like when you should have certain vaccines, screening for sexually transmitted diseases, bone density testing, colonoscopy, mammograms, etc.   MAMMOGRAMS:  All women over 40 years old should have a yearly mammogram. Many facilities now offer a "3D" mammogram, which may cost around $50 extra out of pocket. If possible,  we recommend you accept the option to have the 3D mammogram performed.  It both reduces the number of women who will be called back for extra views which then turn out to be normal, and it is better than the routine mammogram at detecting truly abnormal areas.    COLONOSCOPY:  Colonoscopy to screen for colon cancer is recommended for all women at age 50.  We know, you hate the idea of the prep.  We agree, BUT, having colon cancer and not knowing it is worse!!  Colon cancer so often starts as a polyp that can be seen and removed at colonscopy, which can quite literally save your life!  And if your first colonoscopy is normal and you have no family history of colon cancer, most women don't have to have it again for 10 years.  Once every ten years, you can do something that may end up saving your life, right?  We will be happy to help you get it scheduled when you are ready.    Be sure to check your insurance coverage so you understand how much it will cost.  It may be covered as a preventative service at no cost, but you should check your particular policy.      Breast Self-Awareness Breast self-awareness means being familiar with how your breasts look and feel. It involves checking your breasts regularly and reporting any changes to your health care provider. Practicing breast self-awareness is important. A change in your breasts can be a sign of a serious medical problem. Being familiar with how your breasts look and feel allows  you to find any problems early, when treatment is more likely to be successful. All women should practice breast self-awareness, including women who have had breast implants. How to do a breast self-exam One way to learn what is normal for your breasts and whether your breasts are changing is to do a breast self-exam. To do a breast self-exam: Look for Changes  1. Remove all the clothing above your waist. 2. Stand in front of a mirror in a room with good lighting. 3. Put your hands on your hips. 4. Push your hands firmly downward. 5. Compare your breasts in the mirror. Look for differences between them (asymmetry), such as: ? Differences in shape. ? Differences in size. ? Puckers, dips, and bumps in one breast and not the other. 6. Look at each breast for changes in your skin, such as: ? Redness. ? Scaly areas. 7. Look for changes in your nipples, such as: ? Discharge. ? Bleeding. ? Dimpling. ? Redness. ? A change in position. Feel for Changes  Carefully feel your breasts for lumps and changes. It is best to do this while lying on your back on the floor and again while sitting or standing in the shower or tub with soapy water on your skin. Feel each breast in the following way:  Place the arm on the side of the breast you are examining above your head.  Feel your breast with the other hand.  Start in the nipple area and make  inch (2 cm) overlapping circles to feel your breast. Use the pads of your three middle fingers to do this. Apply light pressure, then medium pressure, then firm pressure. The light pressure will allow you to feel the tissue closest to the skin. The medium pressure will allow you to feel the tissue that is a little deeper. The firm pressure will allow you to feel the tissue close to the ribs.  Continue the overlapping circles, moving downward over the breast until you feel your ribs below your breast.  Move one finger-width toward the center of the body.  Continue to use the  inch (2 cm) overlapping circles to feel your breast as you move slowly up toward your collarbone.  Continue the up and down exam using all three pressures until you reach your armpit.  Write Down What You Find  Write down what is normal for each breast and any changes that you find. Keep a written record with breast changes or normal findings for each breast. By writing this information down, you do not need to depend only on memory for size, tenderness, or location. Write down where you are in your menstrual cycle, if you are still menstruating. If you are having trouble noticing differences in your breasts, do not get discouraged. With time you will become more familiar with the variations in your breasts and more comfortable with the exam. How often should I examine my breasts? Examine   your breasts every month. If you are breastfeeding, the best time to examine your breasts is after a feeding or after using a breast pump. If you menstruate, the best time to examine your breasts is 5-7 days after your period is over. During your period, your breasts are lumpier, and it may be more difficult to notice changes. When should I see my health care provider? See your health care provider if you notice:  A change in shape or size of your breasts or nipples.  A change in the skin of your breast or nipples, such as a reddened or scaly area.  Unusual discharge from your nipples.  A lump or thick area that was not there before.  Pain in your breasts.  Anything that concerns you.  This information is not intended to replace advice given to you by your health care provider. Make sure you discuss any questions you have with your health care provider. Document Released: 08/19/2005 Document Revised: 01/25/2016 Document Reviewed: 07/09/2015 Elsevier Interactive Patient Education  2018 [employer]  

## 2017-06-05 NOTE — Progress Notes (Signed)
31 y.o. [id number] MarriedHispanicF here for annual exam.   She is on OCP's, cycles about every other month. Bleeds x 3-4 days, light. Occasional cramping, tolerable.  Sexually active, no pain.   Patient's last menstrual period was 06/03/2017.          Sexually active: Yes.    The current method of family planning is OCP (estrogen/progesterone).    Exercising: Yes.    gym  Smoker:  no  Health Maintenance: Pap:  05-27-16 WNL NEG HR HPV  History of abnormal Pap:  no MMG:  07-03-15 WNL  Colonoscopy:  Never BMD:   Never TDaP:  09-27-15 Gardasil: no    reports that she has never smoked. She has never used smokeless tobacco. She reports that she drinks about 2.4 - 3.0 oz of alcohol per week . She reports that she does not use drugs. Works as a Museum/gallery conservator. Two sons, 5 and 9.   Past Medical History:  Diagnosis Date  . No pertinent past medical history     Past Surgical History:  Procedure Laterality Date  . GALLBLADDER SURGERY  2004    Current Outpatient Prescriptions  Medication Sig Dispense Refill  . levonorgestrel-ethinyl estradiol (AVIANE,ALESSE,LESSINA) 0.1-20 MG-MCG tablet Take 1 tablet by mouth daily. 3 Package 3   No current facility-administered medications for this visit.     Family History  Problem Relation Age of Onset  . Diabetes Paternal Grandmother     Review of Systems  Constitutional: Negative.   HENT: Negative.   Eyes: Negative.   Respiratory: Negative.   Cardiovascular: Negative.   Gastrointestinal: Negative.   Endocrine: Negative.   Genitourinary: Negative.   Musculoskeletal: Negative.   Skin: Negative.   Allergic/Immunologic: Negative.   Neurological: Negative.   Psychiatric/Behavioral: Negative.     Exam:   BP 102/60 (BP Location: Right Arm, Patient Position: Sitting, Cuff Size: Normal)   Pulse 60   Resp 14   Ht  (1.651 m)   Wt 205 lb (93 kg)   LMP 06/03/2017   BMI 34.11 kg/m   Weight change: @ Height:   Height:   (165.1 cm)  Ht Readings from Last 3 Encounters:  06/05/17  (1.651 m)  05/27/16  (1.651 m)  07/07/11  (1.651 m)    General appearance: alert, cooperative and appears stated age Head: Normocephalic, without obvious abnormality, atraumatic Neck: no adenopathy, supple, symmetrical, trachea midline and thyroid normal to inspection and palpation Lungs: clear to auscultation bilaterally Cardiovascular: regular rate and rhythm Breasts: normal appearance, no masses or tenderness Abdomen: soft, non-tender; non distended,  no masses,  no organomegaly Extremities: extremities normal, atraumatic, no cyanosis or edema Skin: Skin color, texture, turgor normal. No rashes or lesions Lymph nodes: Cervical, supraclavicular, and axillary nodes normal. No abnormal inguinal nodes palpated Neurologic: Grossly normal   Pelvic: External genitalia:  no lesions              Urethra:  normal appearing urethra with no masses, tenderness or lesions              Bartholins and Skenes: normal                 Vagina: normal appearing vagina with normal color and discharge, no lesions              Cervix: no lesions               Bimanual Exam:  Uterus:  normal size, contour,  position, consistency, mobility, non-tender              Adnexa: no mass, fullness, tenderness               Rectovaginal: Confirms               Anus:  normal sphincter tone, no lesions  Chaperone was present for exam.  A:  Well Woman with normal exam  Vit D def  Fatigue  Low HDL  H/O mildly elevated ALT  Weight gain    P:   Discussed weight loss and exercise  Screening labs, TSH  Discussed breast self exam  Discussed calcium and vit D intake  Continue OCP's

## 2017-06-06 LAB — CBC
HEMATOCRIT: 39.3 % (ref 34.0–46.6)
HEMOGLOBIN: 12.5 g/dL (ref 11.1–15.9)
MCH: 29.3 pg (ref 26.6–33.0)
MCHC: 31.8 g/dL (ref 31.5–35.7)
MCV: 92 fL (ref 79–97)
Platelets: 276 10*3/uL (ref 150–379)
RBC: 4.26 x10E6/uL (ref 3.77–5.28)
RDW: 13.8 % (ref 12.3–15.4)
WBC: 7 10*3/uL (ref 3.4–10.8)

## 2017-06-06 LAB — COMPREHENSIVE METABOLIC PANEL
A/G RATIO: 1.5 (ref 1.2–2.2)
ALBUMIN: 4.4 g/dL (ref 3.5–5.5)
ALT: 19 IU/L (ref 0–32)
AST: 16 IU/L (ref 0–40)
Alkaline Phosphatase: 63 IU/L (ref 39–117)
BILIRUBIN TOTAL: 0.2 mg/dL (ref 0.0–1.2)
BUN / CREAT RATIO: 23 (ref 9–23)
BUN: 16 mg/dL (ref 6–20)
CHLORIDE: 105 mmol/L (ref 96–106)
CO2: 19 mmol/L — ABNORMAL LOW (ref 20–29)
Calcium: 9.3 mg/dL (ref 8.7–10.2)
Creatinine, Ser: 0.7 mg/dL (ref 0.57–1.00)
GFR calc non Af Amer: 114 mL/min/{1.73_m2} (ref >59)
GFR, EST AFRICAN AMERICAN: 132 mL/min/{1.73_m2} (ref >59)
GLOBULIN, TOTAL: 2.9 g/dL (ref 1.5–4.5)
GLUCOSE: 96 mg/dL (ref 65–99)
Potassium: 4.6 mmol/L (ref 3.5–5.2)
SODIUM: 139 mmol/L (ref 134–144)
TOTAL PROTEIN: 7.3 g/dL (ref 6.0–8.5)

## 2017-06-06 LAB — HEMOGLOBIN A1C
Est. average glucose Bld gHb Est-mCnc: 105 mg/dL
Hgb A1c MFr Bld: 5.3 % (ref 4.8–5.6)

## 2017-06-06 LAB — LIPID PANEL
CHOL/HDL RATIO: 3.5 ratio (ref 0.0–4.4)
Cholesterol, Total: 128 mg/dL (ref 100–199)
HDL: 37 mg/dL — ABNORMAL LOW (ref >39)
LDL CALC: 55 mg/dL (ref 0–99)
TRIGLYCERIDES: 180 mg/dL — AB (ref 0–149)
VLDL Cholesterol Cal: 36 mg/dL (ref 5–40)

## 2017-06-06 LAB — TSH: TSH: 3.21 u[IU]/mL (ref 0.450–4.500)

## 2017-06-06 LAB — VITAMIN D 25 HYDROXY (VIT D DEFICIENCY, FRACTURES): VIT D 25 HYDROXY: 17 ng/mL — AB (ref 30.0–100.0)

## 2017-06-29 ENCOUNTER — Other Ambulatory Visit: Payer: Self-pay | Admitting: Obstetrics and Gynecology

## 2017-06-30 ENCOUNTER — Other Ambulatory Visit: Payer: Self-pay | Admitting: Obstetrics and Gynecology

## 2017-07-01 ENCOUNTER — Telehealth: Payer: Self-pay | Admitting: Obstetrics and Gynecology

## 2017-07-01 MED ORDER — LEVONORGESTREL-ETHINYL ESTRAD 0.1-20 MG-MCG PO TABS: 1.0000 | ORAL_TABLET | Freq: Every day | ORAL | 3 refills | Status: DC

## 2017-07-01 NOTE — Telephone Encounter (Signed)
Patient says pharmacy does not have prescription for birth control. [facility] [place] at [phone].

## 2017-07-01 NOTE — Telephone Encounter (Signed)
Medication refill request: OCP Last AEX:  06/05/17 JJ Next AEX: 06/24/18 JJ Last MMG (if hormonal medication request):  Refill authorized: 06/05/17 #[id number]/[id number] to [facility] [address] [place].

## 2017-07-01 NOTE — Telephone Encounter (Signed)
Spoke with patient. Patient states that rx for her OCP is not at her pharmacy. States that she received notification that it was ready to pick up, but now they are telling her she needs an up to date rx. Rx for Aviane #3 3RF sent to pharmacy on file. Patient verbalizes understanding.  Routing to provider for final review. Patient agreeable to disposition. Will close encounter.

## 2017-09-09 ENCOUNTER — Other Ambulatory Visit: Payer: Self-pay

## 2017-09-09 ENCOUNTER — Telehealth: Payer: Self-pay | Admitting: Obstetrics and Gynecology

## 2017-09-09 NOTE — Telephone Encounter (Signed)
Left message on voicemail regarding missed lab appointment. °

## 2017-09-16 NOTE — Telephone Encounter (Signed)
Left second message on voicemail to reschedule missed lab appointment. Ok to close encounter?

## 2018-05-23 ENCOUNTER — Other Ambulatory Visit: Payer: Self-pay | Admitting: Obstetrics and Gynecology

## 2018-05-25 NOTE — Telephone Encounter (Signed)
Medication refill request: Vienva  Last AEX:  06/05/17 Next AEX: 06/24/18  Last MMG (if hormonal medication request): NA Refill authorized: #[id number] with 0 refill. Please refill until AEX if appropriate.

## 2018-06-22 NOTE — Progress Notes (Signed)
32 y.o. [id number] Married Other or two or more races Hispanic or Latino female here for annual exam.  Sexually active, no pain.  Period Cycle (Days): 28 Period Duration (Days): 3 days Period Pattern: Regular Menstrual Flow: Moderate Menstrual Control: Tampon Menstrual Control Change Freq (Hours): changes tampon every 8 hours Dysmenorrhea: (!) Moderate Dysmenorrhea Symptoms: Cramping  She injured her knee last spring, is going to have surgery in the next few months.   Patient's last menstrual period was 06/01/2018 (exact date).          Sexually active: Yes.    The current method of family planning is OCP (estrogen/progesterone).    Exercising: No.  The patient does not participate in regular exercise at present. Smoker:  No  Health Maintenance: Pap:  05-27-16 WNL NEG HR HPV  History of abnormal Pap:  no MMG:  07-03-15 Birads 1 negative Colonoscopy:  Never BMD:   Never TDaP:  09-27-15 Gardasil: Never, discussed   reports that she has never smoked. She has never used smokeless tobacco. She reports that she drinks about 2.0 - 3.0 standard drinks of alcohol per week. She reports that she does not use drugs. Works as a Museum/gallery conservator. 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 She has an [place] Bulldog puppy, 78 weeks old.   Past Medical History:  Diagnosis Date  . No pertinent past medical history   . Torn ACL   . Torn meniscus     Past Surgical History:  Procedure Laterality Date  . GALLBLADDER SURGERY  2004    Current Outpatient Medications  Medication Sig Dispense Refill  . VIENVA 0.1-20 MG-MCG tablet TAKE 1 TABLET BY MOUTH DAILY 84 tablet 0   No current facility-administered medications for this visit.     Family History  Problem Relation Age of Onset  . Diabetes Paternal Grandmother   . Diabetes Mother   . Diabetes Father     Review of Systems  Constitutional:       Weight gain  HENT: Negative.   Eyes: Negative.   Respiratory: Negative.   Cardiovascular: Negative.    Gastrointestinal: Negative.   Endocrine: Negative.   Genitourinary: Negative.   Musculoskeletal: Negative.   Skin: Negative.   Allergic/Immunologic: Negative.   Neurological: Negative.   Hematological: Negative.   Psychiatric/Behavioral: Negative.     Exam:   BP 128/80 (BP Location: Right Arm, Patient Position: Sitting, Cuff Size: Normal)   Pulse 64   Ht 5' 5.75" (1.67 m)   Wt 217 lb 6.4 oz (98.6 kg)   LMP 06/01/2018 (Exact Date)   BMI 35.36 kg/m   Weight change: @WEIGHTCHANGE @ Height:   Height: 5' 5.75" (167 cm)  Ht Readings from Last 3 Encounters:  06/24/18 5' 5.75" (1.67 m)  06/05/17 5\' 5"  (1.651 m)  05/27/16 5\' 5"  (1.651 m)    General appearance: alert, cooperative and appears stated age Head: Normocephalic, without obvious abnormality, atraumatic Neck: no adenopathy, supple, symmetrical, trachea midline and thyroid normal to inspection and palpation Lungs: clear to auscultation bilaterally Cardiovascular: regular rate and rhythm Breasts: normal appearance, no masses or tenderness Abdomen: soft, non-tender; non distended,  no masses,  no organomegaly Extremities: extremities normal, atraumatic, no cyanosis or edema Skin: Skin color, texture, turgor normal. No rashes or lesions Lymph nodes: Cervical, supraclavicular, and axillary nodes normal. No abnormal inguinal nodes palpated Neurologic: Grossly normal   Pelvic: External genitalia:  no lesions              Urethra:  normal appearing  urethra with no masses, tenderness or lesions              Bartholins and Skenes: normal                 Vagina: normal appearing vagina with normal color and discharge, no lesions              Cervix: no lesions               Bimanual Exam:  Uterus:  normal size, contour, position, consistency, mobility, non-tender              Adnexa: no mass, fullness, tenderness               Rectovaginal: Confirms               Anus:  normal sphincter tone, no lesions  Chaperone was present  for exam.  A:  Well Woman with normal exam  She has gained 12 lbs in the last year, has a knee injury  Vit d def  H/O low HDL  BMI 35  FH of DM    P:   Screening labs, TSH, HgbA1C and vit D  No pap this year  # Given for medical weight lost specialist and primary MD  Continue OCP's  Discussed breast self exam  Discussed calcium and vit D intake

## 2018-06-24 ENCOUNTER — Ambulatory Visit (INDEPENDENT_AMBULATORY_CARE_PROVIDER_SITE_OTHER): Payer: 59 | Admitting: Obstetrics and Gynecology

## 2018-06-24 ENCOUNTER — Encounter: Payer: Self-pay | Admitting: Obstetrics and Gynecology

## 2018-06-24 ENCOUNTER — Other Ambulatory Visit: Payer: Self-pay

## 2018-06-24 VITALS — BP 128/80 | HR 64 | Ht 65.75 in | Wt 217.4 lb

## 2018-06-24 DIAGNOSIS — Z0000 Encounter for general adult medical examination without abnormal findings: Secondary | ICD-10-CM

## 2018-06-24 DIAGNOSIS — E559 Vitamin D deficiency, unspecified: Secondary | ICD-10-CM | POA: Diagnosis not present

## 2018-06-24 DIAGNOSIS — Z6835 Body mass index (BMI) 35.0-35.9, adult: Secondary | ICD-10-CM | POA: Diagnosis not present

## 2018-06-24 DIAGNOSIS — Z01419 Encounter for gynecological examination (general) (routine) without abnormal findings: Secondary | ICD-10-CM

## 2018-06-24 DIAGNOSIS — Z833 Family history of diabetes mellitus: Secondary | ICD-10-CM

## 2018-06-24 DIAGNOSIS — E786 Lipoprotein deficiency: Secondary | ICD-10-CM | POA: Diagnosis not present

## 2018-06-24 MED ORDER — LEVONORGESTREL-ETHINYL ESTRAD 0.1-20 MG-MCG PO TABS: 1.0000 | ORAL_TABLET | Freq: Every day | ORAL | 3 refills | Status: DC

## 2018-06-24 NOTE — Patient Instructions (Signed)

## 2018-06-26 LAB — TSH: TSH: 3.23 u[IU]/mL (ref 0.450–4.500)

## 2018-06-26 LAB — HEMOGLOBIN A1C
Est. average glucose Bld gHb Est-mCnc: 111 mg/dL
Hgb A1c MFr Bld: 5.5 % (ref 4.8–5.6)

## 2018-06-26 LAB — CBC
HEMOGLOBIN: 12.2 g/dL (ref 11.1–15.9)
Hematocrit: 36.3 % (ref 34.0–46.6)
MCH: 29.7 pg (ref 26.6–33.0)
MCHC: 33.6 g/dL (ref 31.5–35.7)
MCV: 88 fL (ref 79–97)
PLATELETS: 310 10*3/uL (ref 150–450)
RBC: 4.11 x10E6/uL (ref 3.77–5.28)
RDW: 12.8 % (ref 12.3–15.4)
WBC: 8.9 10*3/uL (ref 3.4–10.8)

## 2018-06-26 LAB — COMPREHENSIVE METABOLIC PANEL
A/G RATIO: 1.4 (ref 1.2–2.2)
ALBUMIN: 4.2 g/dL (ref 3.5–5.5)
ALT: 21 IU/L (ref 0–32)
AST: 13 IU/L (ref 0–40)
Alkaline Phosphatase: 72 IU/L (ref 39–117)
BUN / CREAT RATIO: 18 (ref 9–23)
BUN: 13 mg/dL (ref 6–20)
Bilirubin Total: <0.2 mg/dL (ref 0.0–1.2)
CALCIUM: 9.1 mg/dL (ref 8.7–10.2)
CO2: 20 mmol/L (ref 20–29)
Chloride: 107 mmol/L — ABNORMAL HIGH (ref 96–106)
Creatinine, Ser: 0.73 mg/dL (ref 0.57–1.00)
GFR, EST AFRICAN AMERICAN: 124 mL/min/{1.73_m2} (ref >59)
GFR, EST NON AFRICAN AMERICAN: 108 mL/min/{1.73_m2} (ref >59)
Globulin, Total: 3 g/dL (ref 1.5–4.5)
Glucose: 94 mg/dL (ref 65–99)
Potassium: 4.4 mmol/L (ref 3.5–5.2)
Sodium: 140 mmol/L (ref 134–144)
TOTAL PROTEIN: 7.2 g/dL (ref 6.0–8.5)

## 2018-06-26 LAB — LIPID PANEL
CHOL/HDL RATIO: 3.7 ratio (ref 0.0–4.4)
Cholesterol, Total: 125 mg/dL (ref 100–199)
HDL: 34 mg/dL — ABNORMAL LOW (ref >39)
LDL Calculated: 51 mg/dL (ref 0–99)
Triglycerides: 200 mg/dL — ABNORMAL HIGH (ref 0–149)
VLDL CHOLESTEROL CAL: 40 mg/dL (ref 5–40)

## 2018-06-26 LAB — VITAMIN D 25 HYDROXY (VIT D DEFICIENCY, FRACTURES): Vit D, 25-Hydroxy: 18.7 ng/mL — ABNORMAL LOW (ref 30.0–100.0)

## 2018-07-24 ENCOUNTER — Telehealth: Payer: Self-pay | Admitting: Obstetrics and Gynecology

## 2018-07-24 NOTE — Telephone Encounter (Signed)
Message not needed. °

## 2018-08-21 ENCOUNTER — Other Ambulatory Visit: Payer: Self-pay | Admitting: Obstetrics and Gynecology

## 2018-08-21 NOTE — Telephone Encounter (Signed)
Patient calling for refill of her birth control. [facility] [place] at [phone].

## 2018-08-21 NOTE — Telephone Encounter (Signed)
Medication refill request: VIENVA Last AEX:  06/24/18 JJ Next AEX: 06/30/19 Last MMG (if hormonal medication request): 07/03/15 MMG w/Right breast US #BIRADS 1 negative/density c Refill authorized: 06/24/18 #84 w/3 refills; today please advise

## 2018-08-23 NOTE — Telephone Encounter (Signed)
I called in one year of OCP's at her annual exam in 06/24/18. Is the request a pharmacy error?

## 2018-08-24 NOTE — Telephone Encounter (Signed)
Spoke with the pharmacy who has refills on file for the patient. Will fill rx at this time. Left detailed message for patient stating that the pharmacy does have refills and will fill rx at this time. Advised to return call with any additional questions or concerns. Encounter closed.

## 2019-03-15 ENCOUNTER — Emergency Department (HOSPITAL_COMMUNITY): Payer: 59

## 2019-03-15 ENCOUNTER — Ambulatory Visit (HOSPITAL_BASED_OUTPATIENT_CLINIC_OR_DEPARTMENT_OTHER)
Admission: RE | Admit: 2019-03-15 | Discharge: 2019-03-15 | Disposition: A | Discharge status: Home / Self Care | Payer: 59 | Source: Ambulatory Visit | Attending: Sports Medicine | Admitting: Sports Medicine

## 2019-03-15 ENCOUNTER — Other Ambulatory Visit (HOSPITAL_COMMUNITY): Payer: Self-pay | Admitting: Sports Medicine

## 2019-03-15 ENCOUNTER — Other Ambulatory Visit: Payer: Self-pay

## 2019-03-15 ENCOUNTER — Observation Stay (HOSPITAL_COMMUNITY)
Admission: EM | Admit: 2019-03-15 | Discharge: 2019-03-16 | Disposition: A | Discharge status: Home / Self Care | Payer: 59 | Attending: Student | Admitting: Student

## 2019-03-15 DIAGNOSIS — I2699 Other pulmonary embolism without acute cor pulmonale: Principal | ICD-10-CM | POA: Diagnosis present

## 2019-03-15 DIAGNOSIS — E669 Obesity, unspecified: Secondary | ICD-10-CM | POA: Insufficient documentation

## 2019-03-15 DIAGNOSIS — M7989 Other specified soft tissue disorders: Secondary | ICD-10-CM

## 2019-03-15 DIAGNOSIS — Z6835 Body mass index (BMI) 35.0-35.9, adult: Secondary | ICD-10-CM | POA: Insufficient documentation

## 2019-03-15 DIAGNOSIS — M79605 Pain in left leg: Secondary | ICD-10-CM

## 2019-03-15 DIAGNOSIS — I82442 Acute embolism and thrombosis of left tibial vein: Secondary | ICD-10-CM | POA: Insufficient documentation

## 2019-03-15 DIAGNOSIS — Z1159 Encounter for screening for other viral diseases: Secondary | ICD-10-CM | POA: Diagnosis not present

## 2019-03-15 DIAGNOSIS — Z9889 Other specified postprocedural states: Secondary | ICD-10-CM | POA: Insufficient documentation

## 2019-03-15 DIAGNOSIS — Z793 Long term (current) use of hormonal contraceptives: Secondary | ICD-10-CM | POA: Insufficient documentation

## 2019-03-15 DIAGNOSIS — J90 Pleural effusion, not elsewhere classified: Secondary | ICD-10-CM | POA: Insufficient documentation

## 2019-03-15 DIAGNOSIS — I82459 Acute embolism and thrombosis of unspecified peroneal vein: Secondary | ICD-10-CM | POA: Insufficient documentation

## 2019-03-15 NOTE — ED Notes (Signed)
PT refused vitals

## 2019-03-15 NOTE — Progress Notes (Signed)
VASCULAR LAB PRELIMINARY  PRELIMINARY  PRELIMINARY  PRELIMINARY  Left lower extremity venous duplex completed.    Preliminary report:  See CV proc for preliminary results.   Called report to Dr. Micheline Maze, [facility], RVT 03/15/2019, 5:34 PM

## 2019-03-15 NOTE — ED Triage Notes (Signed)
To ED for eval of sob and cp. Dx with left leg blood clot today. Recent ACL repair.

## 2019-03-16 ENCOUNTER — Emergency Department (HOSPITAL_COMMUNITY): Payer: 59

## 2019-03-16 ENCOUNTER — Encounter (HOSPITAL_COMMUNITY): Payer: Self-pay | Admitting: Radiology

## 2019-03-16 DIAGNOSIS — I2699 Other pulmonary embolism without acute cor pulmonale: Principal | ICD-10-CM

## 2019-03-16 DIAGNOSIS — E669 Obesity, unspecified: Secondary | ICD-10-CM

## 2019-03-16 DIAGNOSIS — I82442 Acute embolism and thrombosis of left tibial vein: Secondary | ICD-10-CM

## 2019-03-16 DIAGNOSIS — I82452 Acute embolism and thrombosis of left peroneal vein: Secondary | ICD-10-CM

## 2019-03-16 LAB — HIV ANTIBODY (ROUTINE TESTING W REFLEX): HIV Screen 4th Generation wRfx: NONREACTIVE

## 2019-03-16 LAB — SARS CORONAVIRUS 2 BY RT PCR (HOSPITAL ORDER, PERFORMED IN ~~LOC~~ HOSPITAL LAB): SARS Coronavirus 2: NEGATIVE

## 2019-03-16 LAB — CBC WITH DIFFERENTIAL/PLATELET
Abs Immature Granulocytes: 0.02 10*3/uL (ref 0.00–0.07)
Basophils Absolute: 0 10*3/uL (ref 0.0–0.1)
Basophils Relative: 0 %
Eosinophils Absolute: 0.2 10*3/uL (ref 0.0–0.5)
Eosinophils Relative: 2 %
HCT: 39 % (ref 36.0–46.0)
Hemoglobin: 12.8 g/dL (ref 12.0–15.0)
Immature Granulocytes: 0 %
Lymphocytes Relative: 19 %
Lymphs Abs: 1.8 10*3/uL (ref 0.7–4.0)
MCH: 29.6 pg (ref 26.0–34.0)
MCHC: 32.8 g/dL (ref 30.0–36.0)
MCV: 90.1 fL (ref 80.0–100.0)
Monocytes Absolute: 0.5 10*3/uL (ref 0.1–1.0)
Monocytes Relative: 6 %
Neutro Abs: 6.7 10*3/uL (ref 1.7–7.7)
Neutrophils Relative %: 73 %
Platelets: 379 10*3/uL (ref 150–400)
RBC: 4.33 MIL/uL (ref 3.87–5.11)
RDW: 13.6 % (ref 11.5–15.5)
WBC: 9.2 10*3/uL (ref 4.0–10.5)
nRBC: 0 % (ref 0.0–0.2)

## 2019-03-16 LAB — BASIC METABOLIC PANEL
Anion gap: 13 (ref 5–15)
BUN: 10 mg/dL (ref 6–20)
CO2: 21 mmol/L — ABNORMAL LOW (ref 22–32)
Calcium: 9.4 mg/dL (ref 8.9–10.3)
Chloride: 104 mmol/L (ref 98–111)
Creatinine, Ser: 0.66 mg/dL (ref 0.44–1.00)
GFR calc Af Amer: >60 mL/min (ref >60)
GFR calc non Af Amer: >60 mL/min (ref >60)
Glucose, Bld: 109 mg/dL — ABNORMAL HIGH (ref 70–99)
Potassium: 3.6 mmol/L (ref 3.5–5.1)
Sodium: 138 mmol/L (ref 135–145)

## 2019-03-16 LAB — TROPONIN I (HIGH SENSITIVITY)
Troponin I (High Sensitivity): <2 ng/L (ref <18)
Troponin I (High Sensitivity): <2 ng/L (ref <18)
Troponin I (High Sensitivity): <2 ng/L (ref <18)

## 2019-03-16 LAB — I-STAT BETA HCG BLOOD, ED (MC, WL, AP ONLY): I-stat hCG, quantitative: <5 m[IU]/mL (ref <5)

## 2019-03-16 LAB — HEPARIN LEVEL (UNFRACTIONATED): Heparin Unfractionated: 0.17 IU/mL — ABNORMAL LOW (ref 0.30–0.70)

## 2019-03-16 LAB — BRAIN NATRIURETIC PEPTIDE: B Natriuretic Peptide: 25.8 pg/mL (ref 0.0–100.0)

## 2019-03-16 MED ORDER — RIVAROXABAN 20 MG PO TABS: 20.0000 mg | ORAL_TABLET | Freq: Every day | ORAL | Status: DC

## 2019-03-16 MED ORDER — MORPHINE SULFATE (PF) 4 MG/ML IV SOLN
4.0000 mg | Freq: Once | INTRAVENOUS | Status: AC
  Administered 2019-03-16: 4 mg via INTRAVENOUS
  Filled 2019-03-16: qty 1

## 2019-03-16 MED ORDER — RIVAROXABAN 15 MG PO TABS
15.0000 mg | ORAL_TABLET | Freq: Two times a day (BID) | ORAL | Status: DC
  Administered 2019-03-16: 15 mg via ORAL
  Filled 2019-03-16: qty 1

## 2019-03-16 MED ORDER — IOHEXOL 350 MG/ML SOLN
80.0000 mL | Freq: Once | INTRAVENOUS | Status: AC | PRN
  Administered 2019-03-16: 80 mL via INTRAVENOUS

## 2019-03-16 MED ORDER — METHOCARBAMOL 500 MG PO TABS: 500.0000 mg | ORAL_TABLET | Freq: Four times a day (QID) | ORAL | Status: DC | PRN

## 2019-03-16 MED ORDER — RIVAROXABAN (XARELTO) VTE STARTER PACK (15 & 20 MG): ORAL_TABLET | ORAL | 0 refills | Status: DC

## 2019-03-16 MED ORDER — ONDANSETRON HCL 4 MG/2ML IJ SOLN: 4.0000 mg | Freq: Four times a day (QID) | INTRAMUSCULAR | Status: DC | PRN

## 2019-03-16 MED ORDER — ACETAMINOPHEN 650 MG RE SUPP: 650.0000 mg | Freq: Four times a day (QID) | RECTAL | Status: DC | PRN

## 2019-03-16 MED ORDER — HEPARIN (PORCINE) 25000 UT/250ML-% IV SOLN
1400.0000 [IU]/h | INTRAVENOUS | Status: DC
  Administered 2019-03-16: 1400 [IU]/h via INTRAVENOUS
  Filled 2019-03-16: qty 250

## 2019-03-16 MED ORDER — OXYCODONE HCL 5 MG PO TABS
5.0000 mg | ORAL_TABLET | ORAL | Status: DC | PRN
  Administered 2019-03-16: 5 mg via ORAL
  Filled 2019-03-16: qty 1

## 2019-03-16 MED ORDER — ACETAMINOPHEN 325 MG PO TABS: 650.0000 mg | ORAL_TABLET | Freq: Four times a day (QID) | ORAL | Status: DC | PRN

## 2019-03-16 MED ORDER — ONDANSETRON HCL 4 MG PO TABS: 4.0000 mg | ORAL_TABLET | Freq: Four times a day (QID) | ORAL | Status: DC | PRN

## 2019-03-16 MED ORDER — HEPARIN BOLUS VIA INFUSION
6000.0000 [IU] | Freq: Once | INTRAVENOUS | Status: AC
  Administered 2019-03-16: 04:00:00 6000 [IU] via INTRAVENOUS
  Filled 2019-03-16: qty 6000

## 2019-03-16 MED FILL — XARELTO STARTER PACK: 15 & 20 | 30 days supply | Qty: 51 | Fill #0

## 2019-03-16 NOTE — TOC Benefit Eligibility Note (Signed)
Transition of Care [employer]) Benefit Eligibility Note    Patient Details  Name: Kathleen Mason MRN: [id number] Date of Birth: 10/02/1985   Medication/Dose: [name] 15 mg and Brlinta 2.5 and 37m  Covered?: Yes  Tier: 2 Drug  Prescription Coverage Preferred Pharmacy: Any retail pharmacy  Spoke with Person/Company/Phone Number:: [name] Co-Pay: 35.00 for Mail order or Retail  Prior Approval: No  Deductible: Unmet Out of 3000.  Only 206.08 met       IOrbie PyoPhone Number: 03/16/2019, 11:53 AM

## 2019-03-16 NOTE — ED Provider Notes (Signed)
[facility] Provider Note   CSN: [id number] Arrival date & time: 03/15/19  1740     History   Chief Complaint Chief Complaint  Patient presents with   Shortness of Breath    HPI Kathleen Mason is a 33 y.o. female.     The history is provided by the patient and medical records.     33 y.o. F with recent left ACL and meniscus repair on 03/10/19 by Dr. Madelon Lipsaffrey, presenting to the ED for chest pain and SOB.  States she starting have swelling in the left calf shortly after surgery.  No complications during procedure to her knowledge.  She went in today for follow-up and was found to have DVT in left calf.  States due to her chest pain and SOB she was sent here for further evaluation.  States pain localized on her right side, worse with deep breathing, sharp in nature.  States SOB is not worsening but remains persistent.  No fevers, cough, sick contacts.  No history of DVT or PE prior to this.  No other significant medical history.  Past Medical History:  Diagnosis Date   No pertinent past medical history    Torn ACL    Torn meniscus     Patient Active Problem List   Diagnosis Date Noted   Vitamin D deficiency 06/05/2017    Past Surgical History:  Procedure Laterality Date   GALLBLADDER SURGERY  2004     OB History    Gravida  2   Para  2   Term  2   Preterm      AB      Living  2     SAB      TAB      Ectopic      Multiple      Live Births  1            Home Medications    Prior to Admission medications   Medication Sig Start Date End Date Taking? Authorizing Provider  levonorgestrel-ethinyl estradiol (VIENVA) 0.1-20 MG-MCG tablet Take 1 tablet by mouth daily. 06/24/18   Romualdo BolkJertson, Jill Evelyn, MD    Family History Family History  Problem Relation Age of Onset   Diabetes Paternal Grandmother    Diabetes Mother    Diabetes Father     Social History Social History   Tobacco Use   Smoking  status: Never Smoker   Smokeless tobacco: Never Used  Substance Use Topics   Alcohol use: Yes    Alcohol/week: 2.0 - 3.0 standard drinks    Types: 2 - 3 Standard drinks or equivalent per week   Drug use: No     Allergies   Patient has no known allergies.   Review of Systems Review of Systems  Cardiovascular: Positive for chest pain.  All other systems reviewed and are negative.    Physical Exam Updated Vital Signs BP 122/87 (BP Location: Left Arm)    Pulse 79    Temp 98.4 F (36.9 C) (Oral)    Resp 18    LMP 03/07/2019 (Within Days)    SpO2 97%   Physical Exam Vitals signs and nursing note reviewed.  Constitutional:      Appearance: She is well-developed.  HENT:     Head: Normocephalic and atraumatic.  Eyes:     Conjunctiva/sclera: Conjunctivae normal.     Pupils: Pupils are equal, round, and reactive to light.  Neck:     Musculoskeletal:  Normal range of motion.  Cardiovascular:     Rate and Rhythm: Normal rate and regular rhythm.     Heart sounds: Normal heart sounds.  Pulmonary:     Effort: Pulmonary effort is normal.     Breath sounds: Normal breath sounds. No wheezing, rhonchi or rales.  Abdominal:     General: Bowel sounds are normal.     Palpations: Abdomen is soft.  Musculoskeletal: Normal range of motion.     Comments: Left knee with dressing in place over the , swelling and tenderness noted to the left calf, leg is NVI  Skin:    General: Skin is warm and dry.  Neurological:     Mental Status: She is alert and oriented to person, place, and time.      ED Treatments / Results  Labs (all labs ordered are listed, but only abnormal results are displayed) Labs Reviewed  BASIC METABOLIC PANEL - Abnormal; Notable for the following components:      Result Value   CO2 21 (*)    Glucose, Bld 109 (*)    All other components within normal limits  SARS CORONAVIRUS 2 (HOSPITAL ORDER, PERFORMED IN [facility])  CBC WITH DIFFERENTIAL/PLATELET    BRAIN NATRIURETIC PEPTIDE  HEPARIN LEVEL (UNFRACTIONATED)  I-STAT BETA HCG BLOOD, ED ([facility], [facility], [facility] ONLY)  TROPONIN I (HIGH SENSITIVITY)    EKG None  Radiology Dg Chest 2 View  Result Date: 03/15/2019 CLINICAL DATA:  Short of breath EXAM: CHEST - 2 VIEW COMPARISON:  None. FINDINGS: The heart size and mediastinal contours are within normal limits. Both lungs are clear. The visualized skeletal structures are unremarkable. IMPRESSION: No active cardiopulmonary disease. Electronically Signed   By: Marlan Palauharles  Clark M.D.   On: 03/15/2019 19:21   Ct Angio Chest Pe W And/or Wo Contrast  Addendum Date: 03/16/2019   ADDENDUM REPORT: 03/16/2019 04:01 ADDENDUM: Critical Value/emergent results were called by telephone at the time of interpretation on 03/16/2019 at 4:00 am to [facility] Zipper , who verbally acknowledged these results. Electronically Signed   By: Odessa FlemingH  Hall M.D.   On: 03/16/2019 04:01   Result Date: 03/16/2019 CLINICAL DATA:  33 year old female with left lower extremity DVT status post knee surgery, pleuritic chest pain. EXAM: CT ANGIOGRAPHY CHEST WITH CONTRAST TECHNIQUE: Multidetector CT imaging of the chest was performed using the standard protocol during bolus administration of intravenous contrast. Multiplanar CT image reconstructions and MIPs were obtained to evaluate the vascular anatomy. CONTRAST:  70 milliliters OMNIPAQUE IOHEXOL 350 MG/ML SOLN COMPARISON:  Chest radiographs 03/15/2019. FINDINGS: Cardiovascular: Good contrast bolus timing in the pulmonary arterial tree. Respiratory motion artifact in the basilar segments of the lower lobes. No central or saddle embolus. No pulmonary artery filling defect identified in the left lung. But there is a positive filling defect in the right upper lobe branch seen on series 7, image 132 and series 11, image 82. Negative visible aorta. Borderline to mild cardiomegaly. No pericardial effusion. Mediastinum/Nodes: Negative. Lungs/Pleura: Small layering right  pleural effusion. Major airways are patent. There is a small area of confluent ground-glass opacity in the right costophrenic angle on series 6, image 109. No abnormal right upper lobe opacity. Mild left lower lobe ground-glass opacity is generalized and more resembles atelectasis. Upper Abdomen: Surgically absent gallbladder. Negative visible liver, spleen, pancreas, adrenal glands, kidneys, and bowel in the upper abdomen. Musculoskeletal: No acute osseous abnormality identified. Review of the MIP images confirms the above findings. IMPRESSION: 1. Positive for acute  PE: Thrombus identified in the branch to the right upper lobe. Although suggestion of a small pulmonary infarct in the posterior basal segment raises the possibility of additional clot in the right lower lobe. 2. Small layering right pleural effusion. Electronically Signed: By: H  Hall M.D. On:Odessa Fleming 03/16/2019 03:58   Vas [place] Lower Extremity Venous (dvt)  Result Date: 03/15/2019  Lower Venous Study Indications: Pain. Other Indications: Recent meniscus and ACL repair. Comparison Study: No prior study on file for comparison. Performing Technologist: Sherren Kernsandace Kanady RVS  Examination Guidelines: A complete evaluation includes B-mode imaging, spectral Doppler, color Doppler, and power Doppler as needed of all accessible portions of each vessel. Bilateral testing is considered an integral part of a complete examination. Limited examinations for reoccurring indications may be performed as noted.  +-----+---------------+---------+-----------+----------+-------+  RIGHT Compressibility Phasicity Spontaneity Properties Summary  +-----+---------------+---------+-----------+----------+-------+  CFV   Full            Yes       Yes                             +-----+---------------+---------+-----------+----------+-------+   +---------+---------------+---------+-----------+----------+-------+  LEFT      Compressibility Phasicity Spontaneity Properties Summary   +---------+---------------+---------+-----------+----------+-------+  CFV       Full            Yes       Yes                             +---------+---------------+---------+-----------+----------+-------+  SFJ       Full                                                      +---------+---------------+---------+-----------+----------+-------+  FV Prox   Full                                                      +---------+---------------+---------+-----------+----------+-------+  FV Mid    Full                                                      +---------+---------------+---------+-----------+----------+-------+  FV Distal Full                                                      +---------+---------------+---------+-----------+----------+-------+  PFV       Full                                                      +---------+---------------+---------+-----------+----------+-------+  POP       Full            Yes  Yes                             +---------+---------------+---------+-----------+----------+-------+  PTV       None                                             Acute    +---------+---------------+---------+-----------+----------+-------+  PERO      None                                             Acute    +---------+---------------+---------+-----------+----------+-------+     Summary: Right: No evidence of common femoral vein obstruction. Left: Findings consistent with acute deep vein thrombosis involving the left posterior tibial veins, and left peroneal veins.  *See table(s) above for measurements and observations.    Preliminary     Procedures Procedures (including critical care time)  CRITICAL CARE Performed by: Larene Pickett   Total critical care time: 40 minutes  Critical care time was exclusive of separately billable procedures and treating other patients.  Critical care was necessary to treat or prevent imminent or life-threatening deterioration.  Critical care was time  spent personally by me on the following activities: development of treatment plan with patient and/or surrogate as well as nursing, discussions with consultants, evaluation of patient's response to treatment, examination of patient, obtaining history from patient or surrogate, ordering and performing treatments and interventions, ordering and review of laboratory studies, ordering and review of radiographic studies, pulse oximetry and re-evaluation of patient's condition.   Medications Ordered in ED Medications - No data to display   Initial Impression / Assessment and Plan / ED Course  I have reviewed the triage vital signs and the nursing notes.  Pertinent labs & imaging results that were available during my care of the patient were reviewed by me and considered in my medical decision making (see chart for details).  33 year old female here with right-sided pleuritic chest pain.  She underwent ACL and meniscus repair last week.  She began having swelling of the left calf a few days after surgery.  She had an outpatient venous duplex which confirmed DVT but sent here for further evaluation due to chest pain.  Vitals are stable here without any noted tachycardia or hypoxia.  She is in no acute distress.  She does have swelling and tenderness of the left calf but leg is neurovascularly intact.  She is obviously high risk for PE so will obtain labs and proceed with CTA.  CTA with at least 2 identified pulmonary emboli on right with infarct and developing pleural effusion.  Patient remains hemodynamically stable here.  She was started on heparin and will admit for ongoing care.  COVID screen pending but patient without fever, cough, etc.  Discussed with Dr. Hal Hope-- will admit for ongoing care.  Final Clinical Impressions(s) / ED Diagnoses   Final diagnoses:  Pulmonary embolus, right [facility])    ED Discharge Orders    None       Larene Pickett, PA-C 03/16/19 0418    Mesner, Corene Cornea,  MD 03/16/19 0600

## 2019-03-16 NOTE — Progress Notes (Signed)
ANTICOAGULATION CONSULT NOTE - Initial Consult  Pharmacy Consult for heparin Indication: pulmonary embolus  No Known Allergies  Patient Measurements: Height: 5\' 5"  (165.1 cm) Weight: 215 lb (97.5 kg) IBW/kg (Calculated) : 57 Heparin Dosing Weight: 79 kg  Vital Signs: Temp: 98.4 F (36.9 C) (07/13 2008) Temp Source: Oral (07/13 2008) BP: 125/84 (07/14 0400) Pulse Rate: 74 (07/14 0400)  Labs: Recent Labs    03/16/19 0216  HGB 12.8  HCT 39.0  PLT 379  CREATININE 0.66  TROPONINIHS <2    Estimated Creatinine Clearance: 113.4 mL/min (by C-G formula based on SCr of 0.66 mg/dL).   Medical History: Past Medical History:  Diagnosis Date  . No pertinent past medical history   . Torn ACL   . Torn meniscus     Medications:  See medication history  Assessment: 33 yo lady with recent ACL repair and on birth control pills to start heparin for PE/DVT.  She was not on anticoagulation PTA.  Baseline Hg 12.8, PTLC 379 Goal of Therapy:  Heparin level 0.3-0.7 units/ml Monitor platelets by anticoagulation protocol: Yes   Plan:  Heparin bolus 6000 units and drip at 1400 units/hr Check heparin level in 6 hours  Thanks for allowing pharmacy to be a part of this patient's care.  Excell Seltzer, PharmD Clinical Pharmacist 03/16/2019,4:16 AM

## 2019-03-16 NOTE — Progress Notes (Signed)
Register for Xarelto Indication: pulmonary embolus/DVT  No Known Allergies  Patient Measurements: Height: 5\' 5"  (165.1 cm) Weight: 215 lb (97.5 kg) IBW/kg (Calculated) : 57 Heparin Dosing Weight: 79 kg  Vital Signs: Temp: 98.3 F (36.8 C) (07/14 0725) Temp Source: Oral (07/14 0725) BP: 123/79 (07/14 0725) Pulse Rate: 73 (07/14 0725)  Labs: Recent Labs    03/16/19 0216 03/16/19 0555 03/16/19 0723  HGB 12.8  --   --   HCT 39.0  --   --   PLT 379  --   --   CREATININE 0.66  --   --   TROPONINIHS <2 <2 <2    Estimated Creatinine Clearance: 113.4 mL/min (by C-G formula based on SCr of 0.66 mg/dL).  Assessment: 33 yo lady with recent ACL repair and on birth control pills to transition from heparin to Xarelto for PE/DVT.  She was not on anticoagulation PTA. Hgb 12.8, Plt 379. Renal fxn stable.   Goal of Therapy:  Monitor platelets by anticoagulation protocol: Yes   Plan:  - Begin Xarelto 15mg  twice daily x 21 days, then transition to 20mg  daily  - Monitor s/sx of bleeding  Thanks for allowing pharmacy to be a part of this patient's care.  Agnes Lawrence, PharmD PGY1 Pharmacy Resident

## 2019-03-16 NOTE — Discharge Instructions (Signed)

## 2019-03-16 NOTE — ED Notes (Signed)
Nurse starting IV and drawing labs. 

## 2019-03-16 NOTE — H&P (Signed)
History and Physical    Amera Banos GLO:[id number] DOB: Mar 05, 1986 DOA: 03/15/2019  PCP: Patient, No Pcp Per  Patient coming from: Home.  Chief Complaint: Shortness of breath chest pain.  HPI: Kathleen Mason is a 33 y.o. female with no significant past medical history presents to the ER with complaints of chest pain and shortness of breath ongoing for last 2 days.  Patient had a recent left knee surgery as outpatient 5 days ago following which patient start developing pain in the left calf.  Last 2 days patient noticing fleeting chest pain pleuritic in nature on the right side of the chest with shortness of breath even at rest.  Denies any fever chills productive cough.  ED Course: In the ER patient had a CT angiogram of the chest which shows pulmonary embolism with no strain pattern with some pleural effusion and possible infarct.  Dopplers revealed left lower extremity tibial and peroneal vein DVT.  Patient was started on heparin admitted for observation given the ongoing pain.  Review of Systems: As per HPI, rest all negative.   Past Medical History:  Diagnosis Date   No pertinent past medical history    Torn ACL    Torn meniscus     Past Surgical History:  Procedure Laterality Date   GALLBLADDER SURGERY  2004     reports that she has never smoked. She has never used smokeless tobacco. She reports current alcohol use of about 2.0 - 3.0 standard drinks of alcohol per week. She reports that she does not use drugs.  No Known Allergies  Family History  Problem Relation Age of Onset   Diabetes Paternal Grandmother    Diabetes Mother    Diabetes Father     Prior to Admission medications   Medication Sig Start Date End Date Taking? Authorizing Provider  levonorgestrel-ethinyl estradiol (VIENVA) 0.1-20 MG-MCG tablet Take 1 tablet by mouth daily. 06/24/18  Yes Salvadore Dom, MD  methocarbamol (ROBAXIN) 500 MG tablet Take 500 mg by mouth every 6 (six) hours as  needed for muscle spasms.  03/10/19  Yes [provider]  oxyCODONE (OXY IR/ROXICODONE) 5 MG immediate release tablet Take 5 mg by mouth every 4 (four) hours as needed for moderate pain.  03/10/19  Yes [provider]    Physical Exam: Constitutional: Moderately built and nourished. Vitals:   03/16/19 0400 03/16/19 0409 03/16/19 0430 03/16/19 0512  BP: 125/84  (!) 120/108 134/80  Pulse: 74  74 77  Resp: 16  19 12   Temp:    98.2 F (36.8 C)  TempSrc:    Oral  SpO2: 98%  99% 100%  Weight:  97.5 kg    Height:  5\' 5"  (1.651 m)     Eyes: Anicteric no pallor. ENMT: No discharge from the ears eyes nose or mouth. Neck: No mass felt.  No neck rigidity. Respiratory: No rhonchi or crepitations. Cardiovascular: S1-S2 heard. Abdomen: Soft nontender bowel sounds present. Musculoskeletal: No edema.  No joint effusion. Skin: No rash.  Skin changes on the left knee from recent surgery. Neurologic: Alert awake oriented to time place and person.  Moves all extremities. Psychiatric: Appears normal.  Normal affect.   Labs on Admission: I have personally reviewed following labs and imaging studies  CBC: Recent Labs  Lab 03/16/19 0216  WBC 9.2  NEUTROABS 6.7  HGB 12.8  HCT 39.0  MCV 90.1  PLT 188   Basic Metabolic Panel: Recent Labs  Lab 03/16/19 0216  NA 138  K 3.6  CL 104  CO2 21*  GLUCOSE 109*  BUN 10  CREATININE 0.66  CALCIUM 9.4   GFR: Estimated Creatinine Clearance: 113.4 mL/min (by C-G formula based on SCr of 0.66 mg/dL). Liver Function Tests: No results for input(s): AST, ALT, ALKPHOS, BILITOT, PROT, ALBUMIN in the last 168 hours. No results for input(s): LIPASE, AMYLASE in the last 168 hours. No results for input(s): AMMONIA in the last 168 hours. Coagulation Profile: No results for input(s): INR, PROTIME in the last 168 hours. Cardiac Enzymes: No results for input(s): CKTOTAL, CKMB, CKMBINDEX, TROPONINI in the last 168 hours. BNP (last 3 results) No  results for input(s): PROBNP in the last 8760 hours. HbA1C: No results for input(s): HGBA1C in the last 72 hours. CBG: No results for input(s): GLUCAP in the last 168 hours. Lipid Profile: No results for input(s): CHOL, HDL, LDLCALC, TRIG, CHOLHDL, LDLDIRECT in the last 72 hours. Thyroid Function Tests: No results for input(s): TSH, T4TOTAL, FREET4, T3FREE, THYROIDAB in the last 72 hours. Anemia Panel: No results for input(s): VITAMINB12, FOLATE, FERRITIN, TIBC, IRON, RETICCTPCT in the last 72 hours. Urine analysis: No results found for: COLORURINE, APPEARANCEUR, LABSPEC, PHURINE, GLUCOSEU, HGBUR, BILIRUBINUR, KETONESUR, PROTEINUR, UROBILINOGEN, NITRITE, LEUKOCYTESUR Sepsis Labs: @LABRCNTIP (procalcitonin:4,lacticidven:4) ) Recent Results (from the past 240 hour(s))  SARS Coronavirus 2 (CEPHEID - [facility]), Hosp Order     Status: None   Collection Time: 03/16/19  4:07 AM   Specimen: Nasopharyngeal Swab  Result Value Ref Range Status   SARS Coronavirus 2 NEGATIVE NEGATIVE Final    Comment: (NOTE) If result is NEGATIVE SARS-CoV-2 target nucleic acids are NOT DETECTED. The SARS-CoV-2 RNA is generally detectable in upper and lower  respiratory specimens during the acute phase of infection. The lowest  concentration of SARS-CoV-2 viral copies this assay can detect is 250  copies / mL. A negative result does not preclude SARS-CoV-2 infection  and should not be used as the sole basis for treatment or other  patient management decisions.  A negative result may occur with  improper specimen collection / handling, submission of specimen other  than nasopharyngeal swab, presence of viral mutation(s) within the  areas targeted by this assay, and inadequate number of viral copies  (<250 copies / mL). A negative result must be combined with clinical  observations, patient history, and epidemiological information. If result is POSITIVE SARS-CoV-2 target nucleic acids  are DETECTED. The SARS-CoV-2 RNA is generally detectable in upper and lower  respiratory specimens dur ing the acute phase of infection.  Positive  results are indicative of active infection with SARS-CoV-2.  Clinical  correlation with patient history and other diagnostic information is  necessary to determine patient infection status.  Positive results do  not rule out bacterial infection or co-infection with other viruses. If result is PRESUMPTIVE POSTIVE SARS-CoV-2 nucleic acids MAY BE PRESENT.   A presumptive positive result was obtained on the submitted specimen  and confirmed on repeat testing.  While 2019 novel coronavirus  (SARS-CoV-2) nucleic acids may be present in the submitted sample  additional confirmatory testing may be necessary for epidemiological  and / or clinical management purposes  to differentiate between  SARS-CoV-2 and other Sarbecovirus currently known to infect humans.  If clinically indicated additional testing with an alternate test  methodology [phone]([id number]) is advised. The SARS-CoV-2 RNA is generally  detectable in upper and lower respiratory sp ecimens during the acute  phase of infection. The expected result is Negative. Fact Sheet  for Patients:  BoilerBrush.com.cy Fact Sheet for Healthcare Providers: https://pope.com/ This test is not yet approved or cleared by the [place] FDA and has been authorized for detection and/or diagnosis of SARS-CoV-2 by FDA under an Emergency Use Authorization (EUA).  This EUA will remain in effect (meaning this test can be used) for the duration of the COVID-19 declaration under Section 564(b)(1) of the Act, 21 U.S.C. section 360bbb-3(b)(1), unless the authorization is terminated or revoked sooner. Performed at [facility], [address], [place], [place] [address]      Radiological Exams on Admission: Dg Chest 2 View  Result Date: 03/15/2019 CLINICAL DATA:   Short of breath EXAM: CHEST - 2 VIEW COMPARISON:  None. FINDINGS: The heart size and mediastinal contours are within normal limits. Both lungs are clear. The visualized skeletal structures are unremarkable. IMPRESSION: No active cardiopulmonary disease. Electronically Signed   By: Marlan Palau M.D.   On: 03/15/2019 19:21   Ct Angio Chest Pe W And/or Wo Contrast  Addendum Date: 03/16/2019   ADDENDUM REPORT: 03/16/2019 04:01 ADDENDUM: Critical Value/emergent results were called by telephone at the time of interpretation on 03/16/2019 at 4:00 am to [facility] , who verbally acknowledged these results. Electronically Signed   By: Odessa Fleming M.D.   On: 03/16/2019 04:01   Result Date: 03/16/2019 CLINICAL DATA:  33 year old female with left lower extremity DVT status post knee surgery, pleuritic chest pain. EXAM: CT ANGIOGRAPHY CHEST WITH CONTRAST TECHNIQUE: Multidetector CT imaging of the chest was performed using the standard protocol during bolus administration of intravenous contrast. Multiplanar CT image reconstructions and MIPs were obtained to evaluate the vascular anatomy. CONTRAST:  70 milliliters OMNIPAQUE IOHEXOL 350 MG/ML SOLN COMPARISON:  Chest radiographs 03/15/2019. FINDINGS: Cardiovascular: Good contrast bolus timing in the pulmonary arterial tree. Respiratory motion artifact in the basilar segments of the lower lobes. No central or saddle embolus. No pulmonary artery filling defect identified in the left lung. But there is a positive filling defect in the right upper lobe branch seen on series 7, image 132 and series 11, image 82. Negative visible aorta. Borderline to mild cardiomegaly. No pericardial effusion. Mediastinum/Nodes: Negative. Lungs/Pleura: Small layering right pleural effusion. Major airways are patent. There is a small area of confluent ground-glass opacity in the right costophrenic angle on series 6, image 109. No abnormal right upper lobe opacity. Mild left lower lobe ground-glass  opacity is generalized and more resembles atelectasis. Upper Abdomen: Surgically absent gallbladder. Negative visible liver, spleen, pancreas, adrenal glands, kidneys, and bowel in the upper abdomen. Musculoskeletal: No acute osseous abnormality identified. Review of the MIP images confirms the above findings. IMPRESSION: 1. Positive for acute PE: Thrombus identified in the branch to the right upper lobe. Although suggestion of a small pulmonary infarct in the posterior basal segment raises the possibility of additional clot in the right lower lobe. 2. Small layering right pleural effusion. Electronically Signed: By: Odessa Fleming M.D. On: 03/16/2019 03:58   Vas [place] Lower Extremity Venous (dvt)  Result Date: 03/15/2019  Lower Venous Study Indications: Pain. Other Indications: Recent meniscus and ACL repair. Comparison Study: No prior study on file for comparison. Performing Technologist: Sherren Kerns RVS  Examination Guidelines: A complete evaluation includes B-mode imaging, spectral Doppler, color Doppler, and power Doppler as needed of all accessible portions of each vessel. Bilateral testing is considered an integral part of a complete examination. Limited examinations for reoccurring indications may be performed as noted.  +-----+---------------+---------+-----------+----------+-------+  RIGHT Compressibility  Phasicity Spontaneity Properties Summary  +-----+---------------+---------+-----------+----------+-------+  CFV   Full            Yes       Yes                             +-----+---------------+---------+-----------+----------+-------+   +---------+---------------+---------+-----------+----------+-------+  LEFT      Compressibility Phasicity Spontaneity Properties Summary  +---------+---------------+---------+-----------+----------+-------+  CFV       Full            Yes       Yes                             +---------+---------------+---------+-----------+----------+-------+  SFJ       Full                                                       +---------+---------------+---------+-----------+----------+-------+  FV Prox   Full                                                      +---------+---------------+---------+-----------+----------+-------+  FV Mid    Full                                                      +---------+---------------+---------+-----------+----------+-------+  FV Distal Full                                                      +---------+---------------+---------+-----------+----------+-------+  PFV       Full                                                      +---------+---------------+---------+-----------+----------+-------+  POP       Full            Yes       Yes                             +---------+---------------+---------+-----------+----------+-------+  PTV       None                                             Acute    +---------+---------------+---------+-----------+----------+-------+  PERO      None  Acute    +---------+---------------+---------+-----------+----------+-------+     Summary: Right: No evidence of common femoral vein obstruction. Left: Findings consistent with acute deep vein thrombosis involving the left posterior tibial veins, and left peroneal veins.  *See table(s) above for measurements and observations.    Preliminary     EKG: Independently reviewed.  Normal sinus rhythm.  Assessment/Plan Principal Problem:   Acute pulmonary embolism (HCC)    1. Acute pulmonary embolism likely provoked in the setting of patient taking estrogen-containing oral contraceptive pills and also recent surgery.  Patient on heparin if continues to remain stable change to oral anticoagulation.  Pain relief medications.  Closely observe. 2. Recent left knee surgery no acute issues at this time.  May discuss with Dr. Madelon Lipsaffrey orthopedic surgeon in the morning.   DVT prophylaxis: Heparin. Code Status: Full code. Family Communication:  Discussed with patient. Disposition Plan: Home. Consults called: None. Admission status: Observation.   Eduard ClosArshad N Karel Turpen MD [facility] Pager [phone].  If 7PM-7AM, please contact night-coverage [email or web address] Password [email or web address]  03/16/2019, 5:43 AM

## 2019-03-16 NOTE — Progress Notes (Signed)
SATURATION QUALIFICATIONS: (This note is used to comply with regulatory documentation for home oxygen)  Patient Saturations on Room Air at Rest = 100%  Patient Saturations on Room Air while Ambulating = 98% 

## 2019-03-16 NOTE — Discharge Summary (Signed)
Physician Discharge Summary  Kura Bethards YNW:[id number] DOB: [birth year] DOA: 03/15/2019  PCP: Patient, No Pcp Per  Admit date: 03/15/2019 Discharge date: 03/16/2019  Admitted From: Home Disposition: Home  Recommendations for Outpatient Follow-up:  1. Follow up with PCP in 1-2 weeks 2. Please obtain CBC at follow up 3. Please follow up on the following pending results: None  Home Health: None Equipment/Devices: None  Discharge Condition: Stable CODE STATUS: Full code  HPI: Per Dr. Hal Hope 33 y.o. female with no significant past medical history presents to the ER with complaints of chest pain and shortness of breath ongoing for last 2 days.  Patient had a recent left knee surgery as outpatient 5 days ago following which patient start developing pain in the left calf.  Last 2 days patient noticing fleeting chest pain pleuritic in nature on the right side of the chest with shortness of breath even at rest.  Denies any fever chills productive cough.  ED Course: In the ER patient had a CT angiogram of the chest which shows pulmonary embolism with no strain pattern with some pleural effusion and possible infarct.  Dopplers revealed left lower extremity tibial and peroneal vein DVT.  Patient was started on heparin admitted for observation given the ongoing pain.  Hospital Course: 68 a female with recent sport related left knee injury s/p outpatient surgery, on OCP for birth control presenting with pleuritic chest pain and shortness of breath and found to have PE without evidence of right heart strain, and left lower extremity tibial and peroneal vein DVT.  Started on heparin drip.  She remained hemodynamically stable.  Did not require supplemental oxygen.  Transitioned to Xarelto.  Ambulated on room air and maintained good oxygen saturation prior to discharge.  See individual problem list below for more.  Discharge Diagnoses:  Acute pulmonary embolism/left lower extremity DVT: Likely  provoked from recent surgery and OCP.  No family history.  No evidence of right heart strain on CT.  Maintained good oxygen saturation with ambulation on room air.  Hemodynamically stable.  Transitioned to Xarelto.  May require anticoagulation for 3 to 6 months. OCP discontinued.  She may need other form of birth control.  Recent left knee injury status post surgery: Stable. -Outpatient follow-up with her orthopedic surgeon.  Obesity: BMI 35.7 -Encourage lifestyle change to lose weight.  Discharge Instructions  Discharge Instructions    Call MD for:  difficulty breathing, headache or visual disturbances   Complete by: As directed    Call MD for:  persistant dizziness or light-headedness   Complete by: As directed    Call MD for:  temperature >100.4   Complete by: As directed    Diet general   Complete by: As directed    Discharge instructions   Complete by: As directed    It has been a pleasure taking care of you! You were admitted with blood clots in your leg and lung.  This is likely due to recent surgery and birth control.  We stopped your birth control.  We started you on blood thinner.  It is very important that you take this medication as prescribed.  Avoid over-the-counter pain medications while taking blood thinner except plain Tylenol. Please review your new medication list and the directions before you take your medications. Please call your primary care office  as soon as possible to schedule hospital follow-up visit in 1 to 2 weeks. Go to your follow-up appointment with orthopedic surgeon as previously planned.  Take care,  Increase activity slowly   Complete by: As directed      Allergies as of 03/16/2019   No Known Allergies     Medication List    STOP taking these medications   levonorgestrel-ethinyl estradiol 0.1-20 MG-MCG tablet Commonly known as: Vienva     TAKE these medications   methocarbamol 500 MG tablet Commonly known as: ROBAXIN Take 500 mg by  mouth every 6 (six) hours as needed for muscle spasms.   oxyCODONE 5 MG immediate release tablet Commonly known as: Oxy IR/ROXICODONE Take 5 mg by mouth every 4 (four) hours as needed for moderate pain.   Rivaroxaban 15 & 20 MG Tbpk Take as directed on package: Start with one 15mg  tablet by mouth twice a day with food. On Day 22, switch to one 20mg  tablet once a day with food.      Follow-up Information    [facility] Follow up on 04/02/2019.   Why: 9:30 for hospital follow up Contact information: [address] [place] [id number] [phone]          Consultations:  None  Procedures/Studies:  2D Echo: None  Dg Chest 2 View  Result Date: 03/15/2019 CLINICAL DATA:  Short of breath EXAM: CHEST - 2 VIEW COMPARISON:  None. FINDINGS: The heart size and mediastinal contours are within normal limits. Both lungs are clear. The visualized skeletal structures are unremarkable. IMPRESSION: No active cardiopulmonary disease. Electronically Signed   By: Marlan Palauharles  Clark M.D.   On: 03/15/2019 19:21   Ct Angio Chest Pe W And/or Wo Contrast  Addendum Date: 03/16/2019   ADDENDUM REPORT: 03/16/2019 04:01 ADDENDUM: Critical Value/emergent results were called by telephone at the time of interpretation on 03/16/2019 at 4:00 am to [facility] , who verbally acknowledged these results. Electronically Signed   By: Odessa FlemingH  Hall M.D.   On: 03/16/2019 04:01   Result Date: 03/16/2019 CLINICAL DATA:  33 year old female with left lower extremity DVT status post knee surgery, pleuritic chest pain. EXAM: CT ANGIOGRAPHY CHEST WITH CONTRAST TECHNIQUE: Multidetector CT imaging of the chest was performed using the standard protocol during bolus administration of intravenous contrast. Multiplanar CT image reconstructions and MIPs were obtained to evaluate the vascular anatomy. CONTRAST:  70 milliliters OMNIPAQUE IOHEXOL 350 MG/ML SOLN COMPARISON:  Chest radiographs  03/15/2019. FINDINGS: Cardiovascular: Good contrast bolus timing in the pulmonary arterial tree. Respiratory motion artifact in the basilar segments of the lower lobes. No central or saddle embolus. No pulmonary artery filling defect identified in the left lung. But there is a positive filling defect in the right upper lobe branch seen on series 7, image 132 and series 11, image 82. Negative visible aorta. Borderline to mild cardiomegaly. No pericardial effusion. Mediastinum/Nodes: Negative. Lungs/Pleura: Small layering right pleural effusion. Major airways are patent. There is a small area of confluent ground-glass opacity in the right costophrenic angle on series 6, image 109. No abnormal right upper lobe opacity. Mild left lower lobe ground-glass opacity is generalized and more resembles atelectasis. Upper Abdomen: Surgically absent gallbladder. Negative visible liver, spleen, pancreas, adrenal glands, kidneys, and bowel in the upper abdomen. Musculoskeletal: No acute osseous abnormality identified. Review of the MIP images confirms the above findings. IMPRESSION: 1. Positive for acute PE: Thrombus identified in the branch to the right upper lobe. Although suggestion of a small pulmonary infarct in the posterior basal segment raises the possibility of additional clot in the right lower lobe. 2. Small layering right pleural effusion.  Electronically Signed: By: Odessa FlemingH  Hall M.D. On: 03/16/2019 03:58   Vas Koreas Lower Extremity Venous (dvt)  Result Date: 03/15/2019  Lower Venous Study Indications: Pain. Other Indications: Recent meniscus and ACL repair. Comparison Study: No prior study on file for comparison. Performing Technologist: Sherren Kernsandace Kanady RVS  Examination Guidelines: A complete evaluation includes B-mode imaging, spectral Doppler, color Doppler, and power Doppler as needed of all accessible portions of each vessel. Bilateral testing is considered an integral part of a complete examination. Limited examinations  for reoccurring indications may be performed as noted.  +-----+---------------+---------+-----------+----------+-------+  RIGHT Compressibility Phasicity Spontaneity Properties Summary  +-----+---------------+---------+-----------+----------+-------+  CFV   Full            Yes       Yes                             +-----+---------------+---------+-----------+----------+-------+   +---------+---------------+---------+-----------+----------+-------+  LEFT      Compressibility Phasicity Spontaneity Properties Summary  +---------+---------------+---------+-----------+----------+-------+  CFV       Full            Yes       Yes                             +---------+---------------+---------+-----------+----------+-------+  SFJ       Full                                                      +---------+---------------+---------+-----------+----------+-------+  FV Prox   Full                                                      +---------+---------------+---------+-----------+----------+-------+  FV Mid    Full                                                      +---------+---------------+---------+-----------+----------+-------+  FV Distal Full                                                      +---------+---------------+---------+-----------+----------+-------+  PFV       Full                                                      +---------+---------------+---------+-----------+----------+-------+  POP       Full            Yes       Yes                             +---------+---------------+---------+-----------+----------+-------+  PTV  None                                             Acute    +---------+---------------+---------+-----------+----------+-------+  PERO      None                                             Acute    +---------+---------------+---------+-----------+----------+-------+     Summary: Right: No evidence of common femoral vein obstruction. Left: Findings consistent with acute deep vein  thrombosis involving the left posterior tibial veins, and left peroneal veins.  *See table(s) above for measurements and observations.    Preliminary       Subjective: No complaints except for left lower extremity pain below her knee.  Denies fever, surgical site swelling or worsening pain. Denies family history of blood clot.  Discharge Exam: Vitals:   03/16/19 0512 03/16/19 0725  BP: 134/80 123/79  Pulse: 77 73  Resp: 12 14  Temp: 98.2 F (36.8 C) 98.3 F (36.8 C)  SpO2: 100% 99%    GENERAL: No acute distress.  Appears well.  HEENT: MMM.  Vision and hearing grossly intact.  NECK: Supple.  No JVD.  LUNGS:  No IWOB. Good air movement bilaterally. HEART:  RRR. Heart sounds normal.  ABD: Bowel sounds present. Soft. Non tender.  MSK/EXT:  Moves all extremities.  Laparoscopic wounds over left knee appears dry and clean without signs of infection.  Left lower extremity slightly swollen compared to right. SKIN: Laparoscopic wounds as above NEURO: Awake, alert and oriented appropriately.  No gross deficit.  PSYCH: Calm. Normal affect.    The results of significant diagnostics from this hospitalization (including imaging, microbiology, ancillary and laboratory) are listed below for reference.     Microbiology: Recent Results (from the past 240 hour(s))  SARS Coronavirus 2 (CEPHEID - Performed in [facility] hospital lab), Hosp Order     Status: None   Collection Time: 03/16/19  4:07 AM   Specimen: Nasopharyngeal Swab  Result Value Ref Range Status   SARS Coronavirus 2 NEGATIVE NEGATIVE Final    Comment: (NOTE) If result is NEGATIVE SARS-CoV-2 target nucleic acids are NOT DETECTED. The SARS-CoV-2 RNA is generally detectable in upper and lower  respiratory specimens during the acute phase of infection. The lowest  concentration of SARS-CoV-2 viral copies this assay can detect is 250  copies / mL. A negative result does not preclude SARS-CoV-2 infection  and should not be used  as the sole basis for treatment or other  patient management decisions.  A negative result may occur with  improper specimen collection / handling, submission of specimen other  than nasopharyngeal swab, presence of viral mutation(s) within the  areas targeted by this assay, and inadequate number of viral copies  (<250 copies / mL). A negative result must be combined with clinical  observations, patient history, and epidemiological information. If result is POSITIVE SARS-CoV-2 target nucleic acids are DETECTED. The SARS-CoV-2 RNA is generally detectable in upper and lower  respiratory specimens dur ing the acute phase of infection.  Positive  results are indicative of active infection with SARS-CoV-2.  Clinical  correlation with patient history and other diagnostic information is  necessary to determine patient infection status.  Positive results  do  not rule out bacterial infection or co-infection with other viruses. If result is PRESUMPTIVE POSTIVE SARS-CoV-2 nucleic acids MAY BE PRESENT.   A presumptive positive result was obtained on the submitted specimen  and confirmed on repeat testing.  While 2019 novel coronavirus  (SARS-CoV-2) nucleic acids may be present in the submitted sample  additional confirmatory testing may be necessary for epidemiological  and / or clinical management purposes  to differentiate between  SARS-CoV-2 and other Sarbecovirus currently known to infect humans.  If clinically indicated additional testing with an alternate test  methodology [phone]) is advised. The SARS-CoV-2 RNA is generally  detectable in upper and lower respiratory sp ecimens during the acute  phase of infection. The expected result is Negative. Fact Sheet for Patients:  BoilerBrush.com.cyhttps://www.fda.gov/media/136312/download Fact Sheet for Healthcare Providers: https://pope.com/https://www.fda.gov/media/136313/download This test is not yet approved or cleared by the [place] FDA and has been authorized for  detection and/or diagnosis of SARS-CoV-2 by FDA under an Emergency Use Authorization (EUA).  This EUA will remain in effect (meaning this test can be used) for the duration of the COVID-19 declaration under Section 564(b)(1) of the Act, 21 U.S.C. section 360bbb-3(b)(1), unless the authorization is terminated or revoked sooner. Performed at [facility], [address] [place], [place] [address]      Labs: BNP (last 3 results) Recent Labs    03/16/19 0216  BNP 25.8   Basic Metabolic Panel: Recent Labs  Lab 03/16/19 0216  NA 138  K 3.6  CL 104  CO2 21*  GLUCOSE 109*  BUN 10  CREATININE 0.66  CALCIUM 9.4   Liver Function Tests: No results for input(s): AST, ALT, ALKPHOS, BILITOT, PROT, ALBUMIN in the last 168 hours. No results for input(s): LIPASE, AMYLASE in the last 168 hours. No results for input(s): AMMONIA in the last 168 hours. CBC: Recent Labs  Lab 03/16/19 0216  WBC 9.2  NEUTROABS 6.7  HGB 12.8  HCT 39.0  MCV 90.1  PLT 379   Cardiac Enzymes: No results for input(s): CKTOTAL, CKMB, CKMBINDEX, TROPONINI in the last 168 hours. BNP: Invalid input(s): POCBNP CBG: No results for input(s): GLUCAP in the last 168 hours. D-Dimer No results for input(s): DDIMER in the last 72 hours. Hgb A1c No results for input(s): HGBA1C in the last 72 hours. Lipid Profile No results for input(s): CHOL, HDL, LDLCALC, TRIG, CHOLHDL, LDLDIRECT in the last 72 hours. Thyroid function studies No results for input(s): TSH, T4TOTAL, T3FREE, THYROIDAB in the last 72 hours.  Invalid input(s): FREET3 Anemia work up No results for input(s): VITAMINB12, FOLATE, FERRITIN, TIBC, IRON, RETICCTPCT in the last 72 hours. Urinalysis No results found for: COLORURINE, APPEARANCEUR, LABSPEC, PHURINE, GLUCOSEU, HGBUR, BILIRUBINUR, KETONESUR, PROTEINUR, UROBILINOGEN, NITRITE, LEUKOCYTESUR Sepsis Labs Invalid input(s): PROCALCITONIN,  WBC,  LACTICIDVEN   Time coordinating discharge: 25  minutes  SIGNED:  Almon Herculesaye T Ariz Terrones, MD  [facility] 03/16/2019, 10:32 AM  If 7PM-7AM, please contact night-coverage [email or web address] Password [email or web address]

## 2019-03-16 NOTE — TOC Transition Note (Addendum)
Transition of Care [facility]) - CM/SW Discharge Note   Patient Details  Name: Kathleen Mason MRN: [id number] Date of Birth: 1986-04-03  Transition of [facility]) CM/SW Contact:  Zenon Mayo, RN Phone Number: 03/16/2019, 12:11 PM   Clinical Narrative:    From home, pta indep, she will be on xarelto, [facility] pharmacy has filled her first 30 days for her, NCM gave her a 10.00 co pay coupon for refills and informed her that she can not use this until her deductible of 3000.00 is met per benefit check , so co pay will be 35.00 . She states she understands.  NCM scheduled her a follow up apt at [facility] clinic, put on AVS.   Final next level of care: Home/Self Care Barriers to Discharge: No Barriers Identified   Patient Goals and CMS Choice Patient states their goals for this hospitalization and ongoing recovery are:: get better   Choice offered to / list presented to : NA  Discharge Placement                       Discharge Plan and Services                DME Arranged: (NA)         HH Arranged: NA          Social Determinants of Health (SDOH) Interventions     Readmission Risk Interventions No flowsheet data found.

## 2019-03-22 ENCOUNTER — Telehealth: Payer: Self-pay | Admitting: Obstetrics and Gynecology

## 2019-03-22 NOTE — Telephone Encounter (Signed)
Spoke with patient. Patient was seen in ER on 03/15/19 for PE after knee surgery. She was on OCP, was advised to stop. Patient has stopped OCP, would like to discuss progesterone only options. OV scheduled for 7/23 at 3:30pm with Dr. Talbert Nan. Advised patient to use BUM if SA. Patient verbalizes understanding.   Routing to provider for final review. Patient is agreeable to disposition. Will close encounter.

## 2019-03-22 NOTE — Telephone Encounter (Signed)
Patient would like to discuss switching birth control to something that does not contain estrogen.

## 2019-03-23 NOTE — Progress Notes (Signed)
GYNECOLOGY  VISIT   HPI: 33 y.o.   Married Other or two or more races Hispanic or Latino  female   [phone] with Patient's last menstrual period was 03/08/2019 (approximate).   here to discuss contraception. History of PE. The patient developed a DVT/PE earlier this month, she was post op from knee surgery and on OCP's. She has an appointment to see a hematologist. She stopped OCP's, interested in another form of contraception.   GYNECOLOGIC HISTORY: Patient's last menstrual period was 03/08/2019 (approximate). Contraception: Abstains Menopausal hormone therapy: None        OB History    Gravida  2   Para  2   Term  2   Preterm      AB      Living  2     SAB      TAB      Ectopic      Multiple      Live Births  1              Patient Active Problem List   Diagnosis Date Noted  . Acute pulmonary embolism (HCC) 03/16/2019  . Vitamin D deficiency 06/05/2017    Past Medical History:  Diagnosis Date  . No pertinent past medical history   . Torn ACL   . Torn meniscus     Past Surgical History:  Procedure Laterality Date  . ANTERIOR CRUCIATE LIGAMENT REPAIR    . GALLBLADDER SURGERY  2004  . MENISCUS REPAIR      Current Outpatient Medications  Medication Sig Dispense Refill  . methocarbamol (ROBAXIN) 500 MG tablet Take 500 mg by mouth every 6 (six) hours as needed for muscle spasms.     Marland Kitchen. oxyCODONE (OXY IR/ROXICODONE) 5 MG immediate release tablet Take 5 mg by mouth every 4 (four) hours as needed for moderate pain.     . Rivaroxaban 15 & 20 MG TBPK Take as directed on package: Start with one 15mg  tablet by mouth twice a day with food. On Day 22, switch to one 20mg  tablet once a day with food. 51 each 0   No current facility-administered medications for this visit.      ALLERGIES: Patient has no known allergies.  Family History  Problem Relation Age of Onset  . Diabetes Paternal Grandmother   . Diabetes Mother   . Diabetes Father     Social History    Socioeconomic History  . Marital status: Married    Spouse name: Not on file  . Number of children: Not on file  . Years of education: Not on file  . Highest education level: Not on file  Occupational History  . Not on file  Social Needs  . Financial resource strain: Not on file  . Food insecurity    Worry: Not on file    Inability: Not on file  . Transportation needs    Medical: Not on file    Non-medical: Not on file  Tobacco Use  . Smoking status: Never Smoker  . Smokeless tobacco: Never Used  Substance and Sexual Activity  . Alcohol use: Yes    Alcohol/week: 2.0 standard drinks    Types: 2 Standard drinks or equivalent per week  . Drug use: No  . Sexual activity: Not Currently    Partners: Male    Birth control/protection: None  Lifestyle  . Physical activity    Days per week: Not on file    Minutes per session: Not on file  .  Stress: Not on file  Relationships  . Social Herbalist on phone: Not on file    Gets together: Not on file    Attends religious service: Not on file    Active member of club or organization: Not on file    Attends meetings of clubs or organizations: Not on file    Relationship status: Not on file  . Intimate partner violence    Fear of current or ex partner: Not on file    Emotionally abused: Not on file    Physically abused: Not on file    Forced sexual activity: Not on file  Other Topics Concern  . Not on file  Social History Narrative  . Not on file    Review of Systems  Constitutional: Negative.   HENT: Negative.   Eyes: Negative.   Respiratory: Negative.   Cardiovascular: Negative.   Gastrointestinal: Negative.   Genitourinary: Negative.   Musculoskeletal: Negative.   Skin: Negative.   Neurological: Negative.   Endo/Heme/Allergies: Negative.   Psychiatric/Behavioral: Negative.     PHYSICAL EXAMINATION:    BP 110/64 (BP Location: Right Arm, Patient Position: Sitting, Cuff Size: Normal)   Pulse 60    Temp 98.1 F (36.7 C) (Skin)   Wt 206 lb (93.4 kg)   LMP 03/08/2019 (Approximate)   BMI 34.28 kg/m     General appearance: alert, cooperative and appears stated age  ASSESSMENT H/O DVT/PE post op from a short knee arthroscopy while on OCP's Needs contraception    PLAN Reviewed options of contraception Reviewed CDC chart of medical eligibility criteria for contraception use.  She can use any progesterone method or barrier method Wants to go on the minipill, will start with her cycle (no plans to be sexually active prior to then) F/U for annual exam in 10/20   An After Visit Summary was printed and given to the patient.  ~15 minutes face to face time of which over 50% was spent in counseling.

## 2019-03-25 ENCOUNTER — Ambulatory Visit (INDEPENDENT_AMBULATORY_CARE_PROVIDER_SITE_OTHER): Payer: 59 | Admitting: Obstetrics and Gynecology

## 2019-03-25 ENCOUNTER — Other Ambulatory Visit: Payer: Self-pay

## 2019-03-25 ENCOUNTER — Encounter: Payer: Self-pay | Admitting: Obstetrics and Gynecology

## 2019-03-25 VITALS — BP 110/64 | HR 60 | Temp 98.1°F | Wt 206.0 lb

## 2019-03-25 DIAGNOSIS — Z3009 Encounter for other general counseling and advice on contraception: Secondary | ICD-10-CM

## 2019-03-25 DIAGNOSIS — Z86711 Personal history of pulmonary embolism: Secondary | ICD-10-CM | POA: Insufficient documentation

## 2019-03-25 MED ORDER — NORETHINDRONE 0.35 MG PO TABS: 1.0000 | ORAL_TABLET | Freq: Every day | ORAL | 1 refills | Status: DC

## 2019-03-25 NOTE — Patient Instructions (Signed)
Norethindrone tablets (contraception) What is this medicine? NORETHINDRONE (nor eth IN drone) is an oral contraceptive. The product contains a female hormone known as a progestin. It is used to prevent pregnancy. This medicine may be used for other purposes; ask your health care provider or pharmacist if you have questions. COMMON BRAND NAME(S): Camila, Deblitane 28-Day, Errin, Heather, Jencycla, Jolivette, Lyza, Nor-QD, Nora-BE, Norlyroc, Ortho Micronor, Sharobel 28-Day What should I tell my health care provider before I take this medicine? They need to know if you have any of these conditions:  blood vessel disease or blood clots  breast, cervical, or vaginal cancer  diabetes  heart disease  kidney disease  liver disease  mental depression  migraine  seizures  stroke  vaginal bleeding  an unusual or allergic reaction to norethindrone, other medicines, foods, dyes, or preservatives  pregnant or trying to get pregnant  breast-feeding How should I use this medicine? Take this medicine by mouth with a glass of water. You may take it with or without food. Follow the directions on the prescription label. Take this medicine at the same time each day and in the order directed on the package. Do not take your medicine more often than directed. Contact your pediatrician regarding the use of this medicine in children. Special care may be needed. This medicine has been used in female children who have started having menstrual periods. A patient package insert for the product will be given with each prescription and refill. Read this sheet carefully each time. The sheet may change frequently. Overdosage: If you think you have taken too much of this medicine contact a poison control center or emergency room at once. NOTE: This medicine is only for you. Do not share this medicine with others. What if I miss a dose? Try not to miss a dose. Every time you miss a dose or take a dose late  your chance of pregnancy increases. When 1 pill is missed (even if only 3 hours late), take the missed pill as soon as possible and continue taking a pill each day at the regular time (use a back up method of birth control for the next 48 hours). If more than 1 dose is missed, use an additional birth control method for the rest of your pill pack until menses occurs. Contact your health care professional if more than 1 dose has been missed. What may interact with this medicine? Do not take this medicine with any of the following medications:  amprenavir or fosamprenavir  bosentan This medicine may also interact with the following medications:  antibiotics or medicines for infections, especially rifampin, rifabutin, rifapentine, and griseofulvin, and possibly penicillins or tetracyclines  aprepitant  barbiturate medicines, such as phenobarbital  carbamazepine  felbamate  modafinil  oxcarbazepine  phenytoin  ritonavir or other medicines for HIV infection or AIDS  St. John's wort  topiramate This list may not describe all possible interactions. Give your health care provider a list of all the medicines, herbs, non-prescription drugs, or dietary supplements you use. Also tell them if you smoke, drink alcohol, or use illegal drugs. Some items may interact with your medicine. What should I watch for while using this medicine? Visit your doctor or health care professional for regular checks on your progress. You will need a regular breast and pelvic exam and Pap smear while on this medicine. Use an additional method of birth control during the first cycle that you take these tablets. If you have any reason to think you   are pregnant, stop taking this medicine right away and contact your doctor or health care professional. If you are taking this medicine for hormone related problems, it may take several cycles of use to see improvement in your condition. This medicine does not protect you  against HIV infection (AIDS) or any other sexually transmitted diseases. What side effects may I notice from receiving this medicine? Side effects that you should report to your doctor or health care professional as soon as possible:  breast tenderness or discharge  pain in the abdomen, chest, groin or leg  severe headache  skin rash, itching, or hives  sudden shortness of breath  unusually weak or tired  vision or speech problems  yellowing of skin or eyes Side effects that usually do not require medical attention (report to your doctor or health care professional if they continue or are bothersome):  changes in sexual desire  change in menstrual flow  facial hair growth  fluid retention and swelling  headache  irritability  nausea  weight gain or loss This list may not describe all possible side effects. Call your doctor for medical advice about side effects. You may report side effects to FDA at 1-800-FDA-1088. Where should I keep my medicine? Keep out of the reach of children. Store at room temperature between 15 and 30 degrees C (59 and 86 degrees F). Throw away any unused medicine after the expiration date. NOTE: This sheet is a summary. It may not cover all possible information. If you have questions about this medicine, talk to your doctor, pharmacist, or health care provider.  2020 Elsevier/Gold Standard (2012-05-08 16:41:35)  

## 2019-04-02 ENCOUNTER — Other Ambulatory Visit: Payer: Self-pay

## 2019-04-02 ENCOUNTER — Ambulatory Visit: Payer: 59 | Attending: Family Medicine | Admitting: Family Medicine

## 2019-04-02 ENCOUNTER — Encounter: Payer: Self-pay | Admitting: Family Medicine

## 2019-04-02 VITALS — BP 115/74 | HR 73 | Temp 98.5°F | Ht 65.75 in | Wt 209.0 lb

## 2019-04-02 DIAGNOSIS — I82492 Acute embolism and thrombosis of other specified deep vein of left lower extremity: Secondary | ICD-10-CM | POA: Diagnosis not present

## 2019-04-02 DIAGNOSIS — I82409 Acute embolism and thrombosis of unspecified deep veins of unspecified lower extremity: Secondary | ICD-10-CM | POA: Insufficient documentation

## 2019-04-02 DIAGNOSIS — I2699 Other pulmonary embolism without acute cor pulmonale: Secondary | ICD-10-CM

## 2019-04-02 DIAGNOSIS — M79662 Pain in left lower leg: Secondary | ICD-10-CM | POA: Diagnosis not present

## 2019-04-02 MED ORDER — RIVAROXABAN 20 MG PO TABS: 20.0000 mg | ORAL_TABLET | Freq: Every day | ORAL | 2 refills | Status: DC

## 2019-04-02 MED ORDER — METHOCARBAMOL 500 MG PO TABS: 500.0000 mg | ORAL_TABLET | Freq: Three times a day (TID) | ORAL | 0 refills | Status: DC | PRN

## 2019-04-02 NOTE — Progress Notes (Signed)
Patient is having pain in calf.

## 2019-04-02 NOTE — Progress Notes (Signed)
Subjective:  Patient ID: Kathleen PayorGabriela Ehly, female    DOB: 12/10/1985  Age: 33 y.o. MRN: [id number]  CC: Hospitalization Follow-up   HPI Kathleen Mason is a 33 year old female who recently underwent left knee ACL repair by her orthopedic Eulah Pont[facility]) and presents today for follow-up after recent hospitalization for acute PE and left lower extremity DVT. Of note she was also on an OCP. Currently on Xarelto 15 mg twice daily and has been compliant with no complaints of bruising or bleeding. Today she complains of pain in her left calf but no swelling.  Denies dyspnea, chest pain. With regards to her knee she is being followed closely by her Orthopedic.  Past Medical History:  Diagnosis Date  . No pertinent past medical history   . Torn ACL   . Torn meniscus     Past Surgical History:  Procedure Laterality Date  . ANTERIOR CRUCIATE LIGAMENT REPAIR    . GALLBLADDER SURGERY  2004  . MENISCUS REPAIR      Family History  Problem Relation Age of Onset  . Diabetes Paternal Grandmother   . Diabetes Mother   . Diabetes Father     No Known Allergies  Outpatient Medications Prior to Visit  Medication Sig Dispense Refill  . methocarbamol (ROBAXIN) 500 MG tablet Take 500 mg by mouth every 6 (six) hours as needed for muscle spasms.     . norethindrone (MICRONOR) 0.35 MG tablet Take 1 tablet (0.35 mg total) by mouth daily. 3 Package 1  . oxyCODONE (OXY IR/ROXICODONE) 5 MG immediate release tablet Take 5 mg by mouth every 4 (four) hours as needed for moderate pain.     . Rivaroxaban 15 & 20 MG TBPK Take as directed on package: Start with one 15mg  tablet by mouth twice a day with food. On Day 22, switch to one 20mg  tablet once a day with food. 51 each 0   No facility-administered medications prior to visit.      ROS Review of Systems  Constitutional: Negative for activity change, appetite change and fatigue.  HENT: Negative for congestion, sinus pressure and sore throat.   Eyes:  Negative for visual disturbance.  Respiratory: Negative for cough, chest tightness, shortness of breath and wheezing.   Cardiovascular: Negative for chest pain and palpitations.  Gastrointestinal: Negative for abdominal distention, abdominal pain and constipation.  Endocrine: Negative for polydipsia.  Genitourinary: Negative for dysuria and frequency.  Musculoskeletal:       See hpi  Skin: Negative for rash.  Neurological: Negative for tremors, light-headedness and numbness.  Hematological: Does not bruise/bleed easily.  Psychiatric/Behavioral: Negative for agitation and behavioral problems.    Objective:  BP 115/74   Pulse 73   Temp 98.5 F (36.9 C) (Oral)   Ht 5' 5.75" (1.67 m)   Wt 209 lb (94.8 kg)   LMP 03/08/2019 (Approximate)   SpO2 100%   BMI 33.99 kg/m   BP/Weight 04/02/2019 03/25/2019 03/16/2019  Systolic BP 115 110 123  Diastolic BP 74 64 79  Wt. (Lbs) 209 206 215  BMI 33.99 34.28 -      Physical Exam Constitutional:      Appearance: She is well-developed.  Cardiovascular:     Rate and Rhythm: Normal rate.     Heart sounds: Normal heart sounds. No murmur.  Pulmonary:     Effort: Pulmonary effort is normal.     Breath sounds: Normal breath sounds. No wheezing or rales.  Chest:     Chest wall: No  tenderness.  Abdominal:     General: Bowel sounds are normal. There is no distension.     Palpations: Abdomen is soft. There is no mass.     Tenderness: There is no abdominal tenderness.  Musculoskeletal:     Right lower leg: No edema.     Left lower leg: No edema.     Comments: Normal appearance of bilateral lower extremities Left knee with brace in place Negative Homans sign  Neurological:     Mental Status: She is alert and oriented to person, place, and time.     CMP Latest Ref Rng & Units 03/16/2019 06/24/2018 06/05/2017  Glucose 70 - 99 mg/dL 696(E109(H) 94 96  BUN 6 - 20 mg/dL 10 13 16   Creatinine 0.44 - 1.00 mg/dL 9.520.66 8.410.73 3.240.70  Sodium 135 - 145 mmol/L  138 140 139  Potassium 3.5 - 5.1 mmol/L 3.6 4.4 4.6  Chloride 98 - 111 mmol/L 104 107(H) 105  CO2 22 - 32 mmol/L 21(L) 20 19(L)  Calcium 8.9 - 10.3 mg/dL 9.4 9.1 9.3  Total Protein 6.0 - 8.5 g/dL - 7.2 7.3  Total Bilirubin 0.0 - 1.2 mg/dL - <4.0<0.2 0.2  Alkaline Phos 39 - 117 IU/L - 72 63  AST 0 - 40 IU/L - 13 16  ALT 0 - 32 IU/L - 21 19    Lipid Panel     Component Value Date/Time   CHOL 125 06/24/2018 0839   TRIG 200 (H) 06/24/2018 0839   HDL 34 (L) 06/24/2018 0839   CHOLHDL 3.7 06/24/2018 0839   CHOLHDL 3.0 05/27/2016 1644   VLDL 27 05/27/2016 1644   LDLCALC 51 06/24/2018 0839    CBC    Component Value Date/Time   WBC 9.2 03/16/2019 0216   RBC 4.33 03/16/2019 0216   HGB 12.8 03/16/2019 0216   HGB 12.2 06/24/2018 0839   HCT 39.0 03/16/2019 0216   HCT 36.3 06/24/2018 0839   PLT 379 03/16/2019 0216   PLT 310 06/24/2018 0839   MCV 90.1 03/16/2019 0216   MCV 88 06/24/2018 0839   MCH 29.6 03/16/2019 0216   MCHC 32.8 03/16/2019 0216   RDW 13.6 03/16/2019 0216   RDW 12.8 06/24/2018 0839   LYMPHSABS 1.8 03/16/2019 0216   MONOABS 0.5 03/16/2019 0216   EOSABS 0.2 03/16/2019 0216   BASOSABS 0.0 03/16/2019 0216    Lab Results  Component Value Date   HGBA1C 5.5 06/24/2018    Assessment & Plan:   1. Other acute pulmonary embolism without acute cor pulmonale (HCC) Provoked by recent surgery She will transition to 20 mg of Xarelto on 04/06/19 Anticoagulation for 3 months until 07/03/2019 - rivaroxaban (XARELTO) 20 MG TABS tablet; Take 1 tablet (20 mg total) by mouth daily with supper. Commence once started pack is completed  Dispense: 30 tablet; Refill: 2  2. Acute deep vein thrombosis (DVT) of other specified vein of left lower extremity (HCC) See #1 above - rivaroxaban (XARELTO) 20 MG TABS tablet; Take 1 tablet (20 mg total) by mouth daily with supper. Commence once started pack is completed  Dispense: 30 tablet; Refill: 2  3. Pain of left calf Secondary to #2 above  Ambulate as tolerated Commence Robaxin - methocarbamol (ROBAXIN) 500 MG tablet; Take 1 tablet (500 mg total) by mouth every 8 (eight) hours as needed for muscle spasms.  Dispense: 90 tablet; Refill: 0   Meds ordered this encounter  Medications  . rivaroxaban (XARELTO) 20 MG TABS tablet    Sig:  Take 1 tablet (20 mg total) by mouth daily with supper. Commence once started pack is completed    Dispense:  30 tablet    Refill:  2  . methocarbamol (ROBAXIN) 500 MG tablet    Sig: Take 1 tablet (500 mg total) by mouth every 8 (eight) hours as needed for muscle spasms.    Dispense:  90 tablet    Refill:  0    Follow-up: Return in about 3 months (around 07/03/2019) for follow up of pulmonary embolism.       Charlott Rakes, MD, FAAFP. [facility] [place], [place]   04/02/2019, 10:18 AM

## 2019-04-02 NOTE — Patient Instructions (Signed)

## 2019-06-29 NOTE — Progress Notes (Deleted)
33 y.o. G73P2002 Married Other or two or more races Hispanic or Latino female here for annual exam.      No LMP recorded.          Sexually active: {yes no:314532}  The current method of family planning is {contraception:315051}.    Exercising: {yes no:314532}  {types:19826} Smoker:  {YES NO:22349}  Health Maintenance: Pap:05-27-16 WNL NEG HR HPV History of abnormal Pap:no MMG:07-03-15 Birads 1 negative Colonoscopy:Never VEL:FYBOF TDaP:09-27-15 Gardasil:Never   reports that she has never smoked. She has never used smokeless tobacco. She reports current alcohol use of about 2.0 standard drinks of alcohol per week. She reports that she does not use drugs.  Past Medical History:  Diagnosis Date  . No pertinent past medical history   . Torn ACL   . Torn meniscus     Past Surgical History:  Procedure Laterality Date  . ANTERIOR CRUCIATE LIGAMENT REPAIR    . GALLBLADDER SURGERY  2004  . MENISCUS REPAIR      Current Outpatient Medications  Medication Sig Dispense Refill  . methocarbamol (ROBAXIN) 500 MG tablet Take 500 mg by mouth every 6 (six) hours as needed for muscle spasms.     . methocarbamol (ROBAXIN) 500 MG tablet Take 1 tablet (500 mg total) by mouth every 8 (eight) hours as needed for muscle spasms. 90 tablet 0  . norethindrone (MICRONOR) 0.35 MG tablet Take 1 tablet (0.35 mg total) by mouth daily. 3 Package 1  . oxyCODONE (OXY IR/ROXICODONE) 5 MG immediate release tablet Take 5 mg by mouth every 4 (four) hours as needed for moderate pain.     . rivaroxaban (XARELTO) 20 MG TABS tablet Take 1 tablet (20 mg total) by mouth daily with supper. Commence once started pack is completed 30 tablet 2  . Rivaroxaban 15 & 20 MG TBPK Take as directed on package: Start with one 15mg  tablet by mouth twice a day with food. On Day 22, switch to one 20mg  tablet once a day with food. 51 each 0   No current facility-administered medications for this visit.     Family History   Problem Relation Age of Onset  . Diabetes Paternal Grandmother   . Diabetes Mother   . Diabetes Father     Review of Systems  Exam:   There were no vitals taken for this visit.  Weight change: @WEIGHTCHANGE @ Height:      Ht Readings from Last 3 Encounters:  04/02/19 5' 5.75" (1.67 m)  03/16/19 5\' 5"  (1.651 m)  06/24/18 5' 5.75" (1.67 m)    General appearance: alert, cooperative and appears stated age Head: Normocephalic, without obvious abnormality, atraumatic Neck: no adenopathy, supple, symmetrical, trachea midline and thyroid {CHL AMB PHY EX THYROID NORM DEFAULT:[phone]::"normal to inspection and palpation"} Lungs: clear to auscultation bilaterally Cardiovascular: regular rate and rhythm Breasts: {Exam; breast:13139::"normal appearance, no masses or tenderness"} Abdomen: soft, non-tender; non distended,  no masses,  no organomegaly Extremities: extremities normal, atraumatic, no cyanosis or edema Skin: Skin color, texture, turgor normal. No rashes or lesions Lymph nodes: Cervical, supraclavicular, and axillary nodes normal. No abnormal inguinal nodes palpated Neurologic: Grossly normal   Pelvic: External genitalia:  no lesions              Urethra:  normal appearing urethra with no masses, tenderness or lesions              Bartholins and Skenes: normal  Vagina: normal appearing vagina with normal color and discharge, no lesions              Cervix: {CHL AMB PHY EX CERVIX NORM [phone]::"no lesions"}               Bimanual Exam:  Uterus:  {CHL AMB PHY EX UTERUS NORM [phone]::"normal size, contour, position, consistency, mobility, non-tender"}              Adnexa: {CHL AMB PHY EX ADNEXA NO MASS [phone]::"no mass, fullness, tenderness"}               Rectovaginal: Confirms               Anus:  normal sphincter tone, no lesions  Chaperone was present for exam.  A:  Well Woman with normal exam  P:

## 2019-06-30 ENCOUNTER — Ambulatory Visit: Payer: 59 | Admitting: Obstetrics and Gynecology

## 2019-06-30 ENCOUNTER — Encounter: Payer: Self-pay | Admitting: Obstetrics and Gynecology

## 2019-07-05 ENCOUNTER — Ambulatory Visit: Payer: 59 | Admitting: Family Medicine

## 2021-05-13 IMAGING — CR CHEST - 2 VIEW
2 series · 2 of 2 positions shown · non-contrast
Comparison: None.

CLINICAL DATA: Short of breath

EXAM:
CHEST - 2 VIEW

[chest lat]
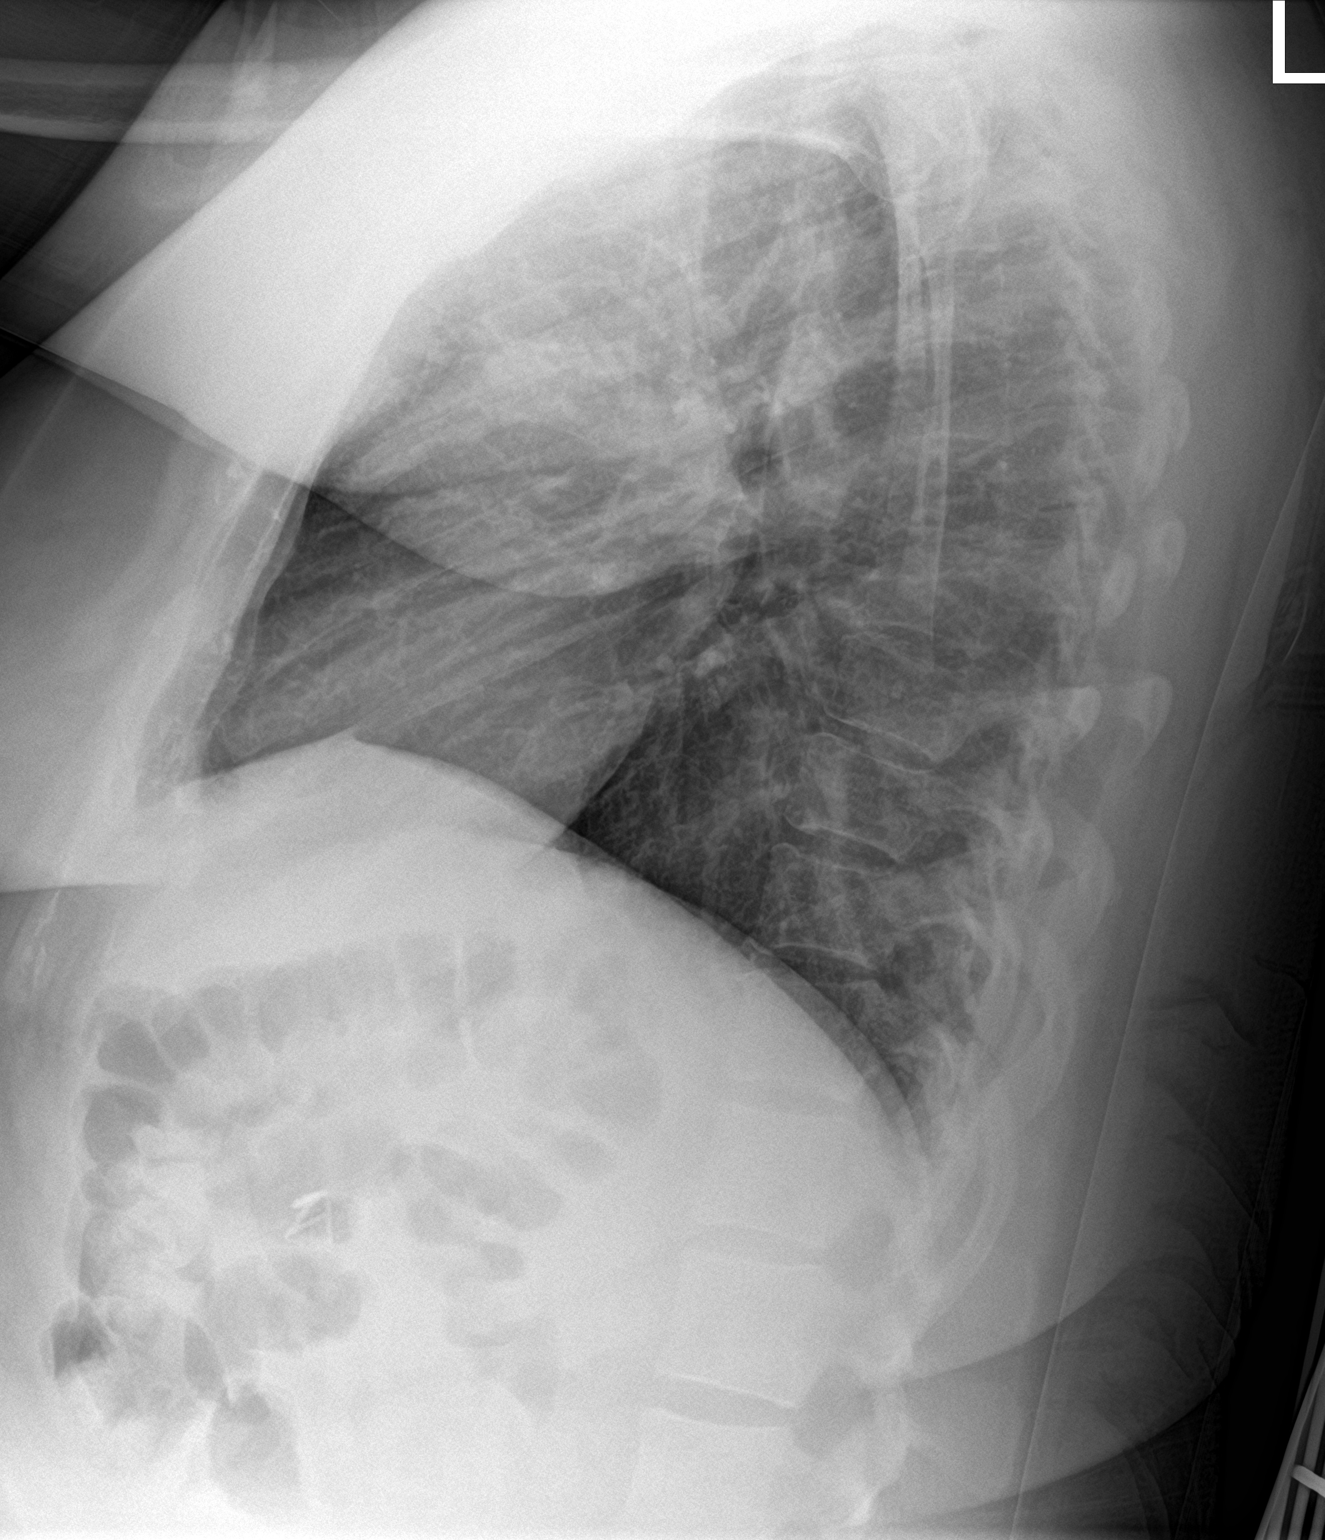

[chest ap]
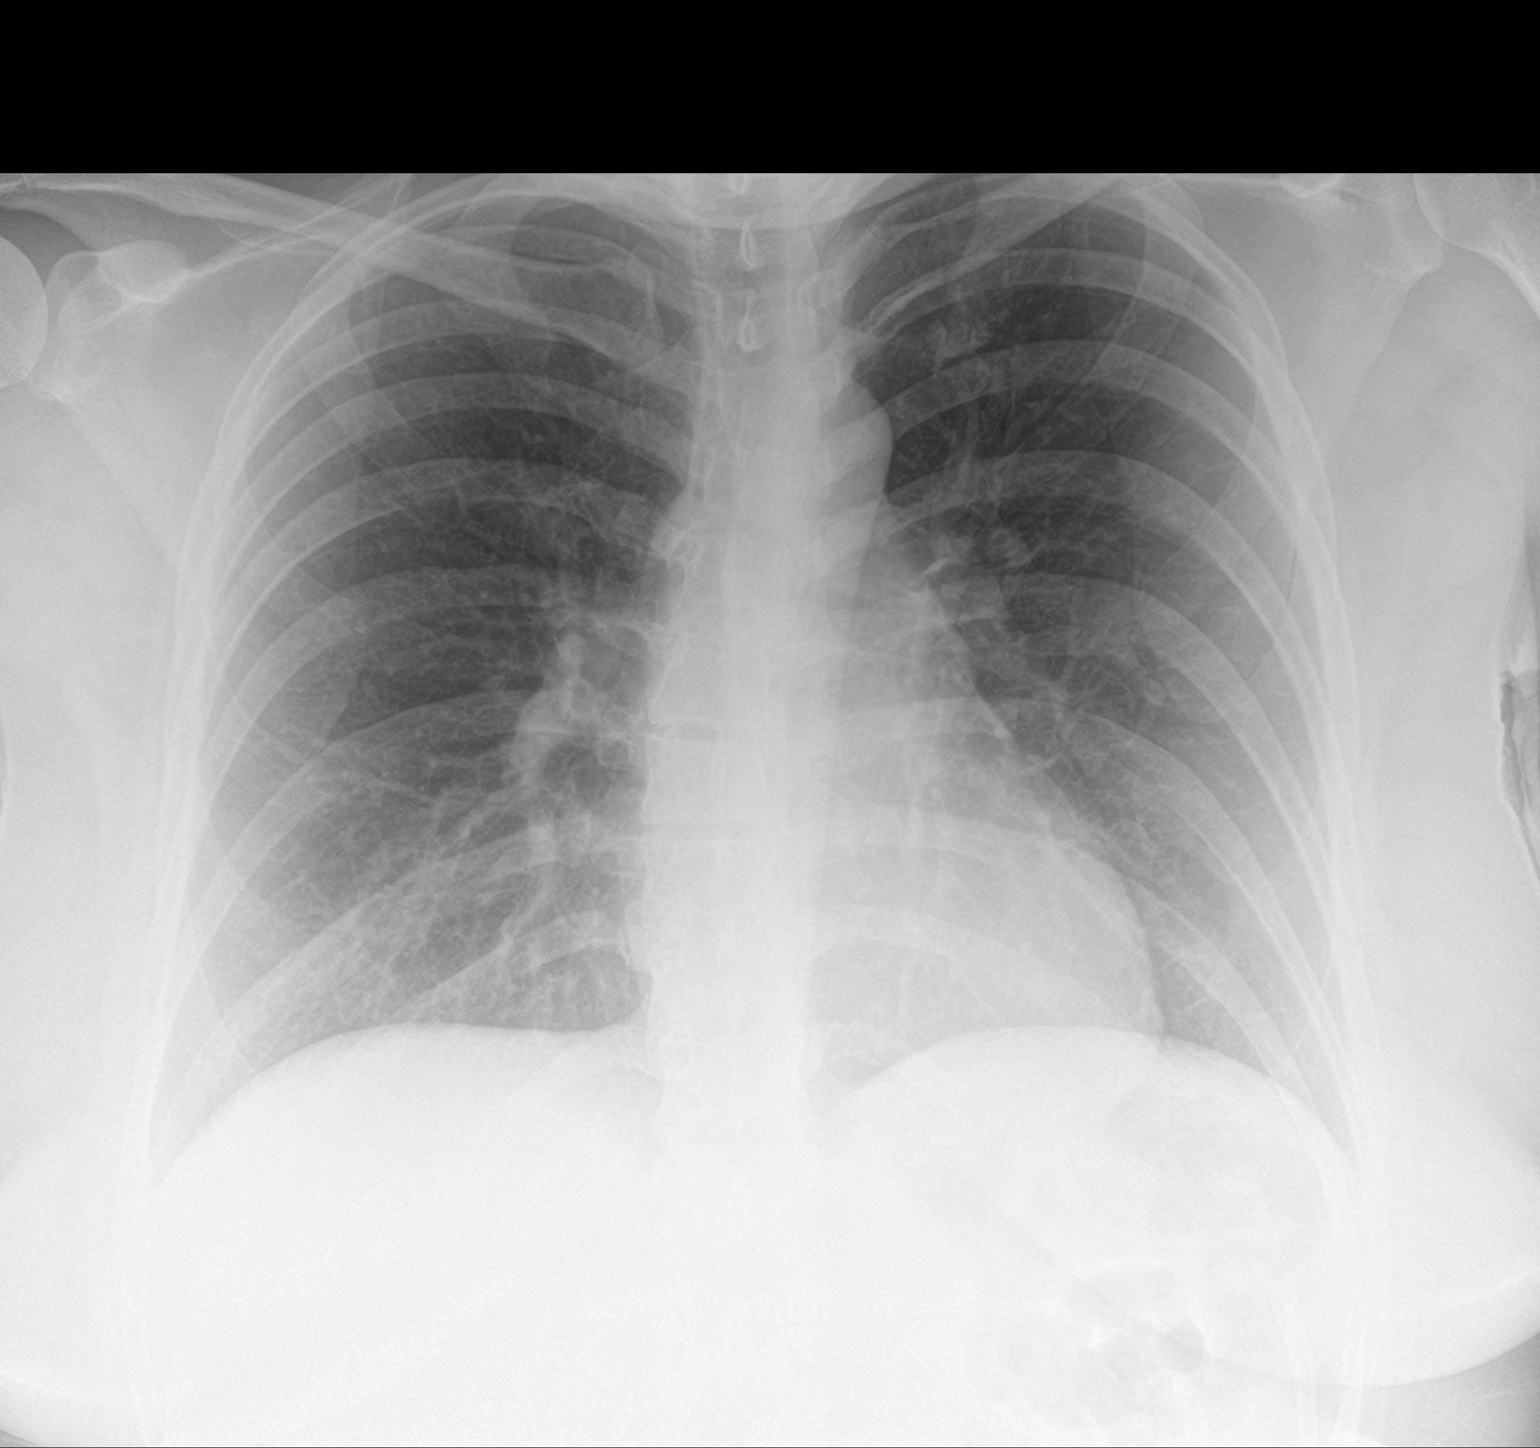

[2 of 2 positions shown; findings below may reference images not displayed]

FINDINGS: The heart size and mediastinal contours are within normal limits.
Both lungs are clear. The visualized skeletal structures are
unremarkable.
IMPRESSION: No active cardiopulmonary disease.

## 2021-05-14 IMAGING — CT CT ANGIOGRAPHY CHEST
2 of 7 series · 17 of 46 positions shown · IV contrast (APPLIED)
Comparison: Chest radiographs 03/15/2019.
COMPARISON: Chest radiographs 03/15/2019.

Addendum:
CLINICAL DATA: 33-year-old female with left lower extremity DVT
status post knee surgery, pleuritic chest pain.

EXAM:
CT ANGIOGRAPHY CHEST WITH CONTRAST
TECHNIQUE: Multidetector CT imaging of the chest was performed using the
standard protocol during bolus administration of intravenous
contrast. Multiplanar CT image reconstructions and MIPs were
obtained to evaluate the vascular anatomy.
CONTRAST:  70 milliliters OMNIPAQUE IOHEXOL 350 MG/ML SOLN

[Series 7: thins · axial · 0.83mm/px · z∈[+1077,+1366]mm · 14 of 465 slices shown]
[im 26/465  lung]
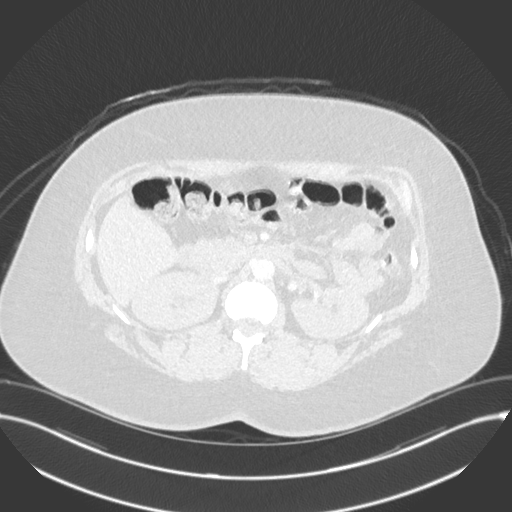
[im 52/465  soft-tissue]
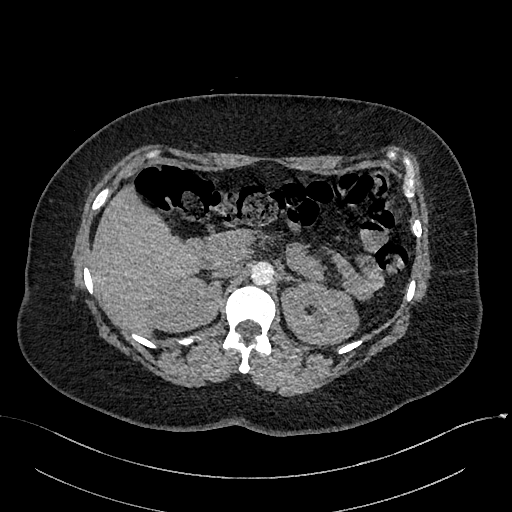
[im 104/465  lung]
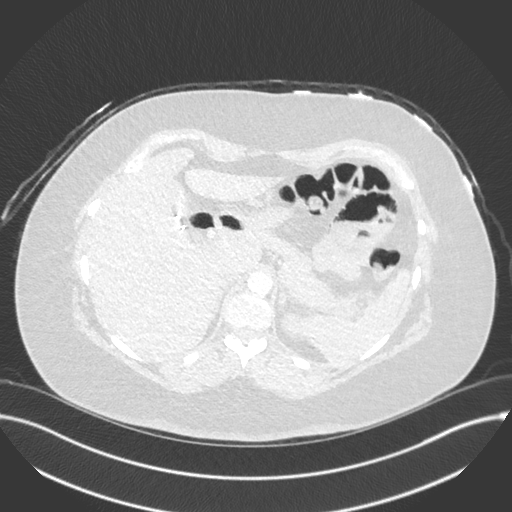
[im 129/465  soft-tissue]
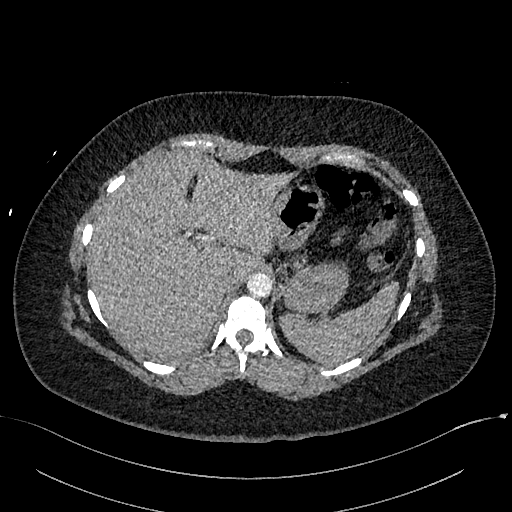
[im 155/465  lung]
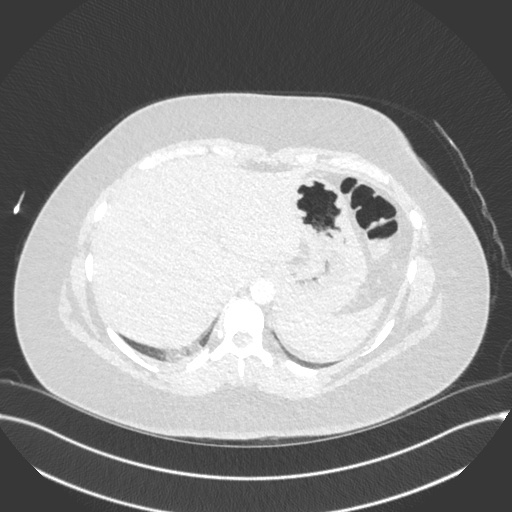
[im 181/465  soft-tissue]
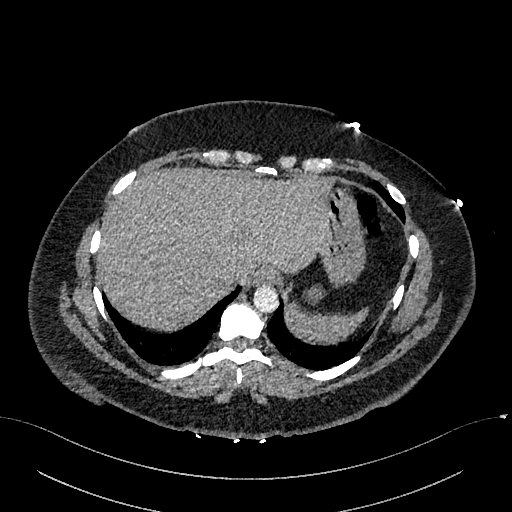
[im 207/465  lung]
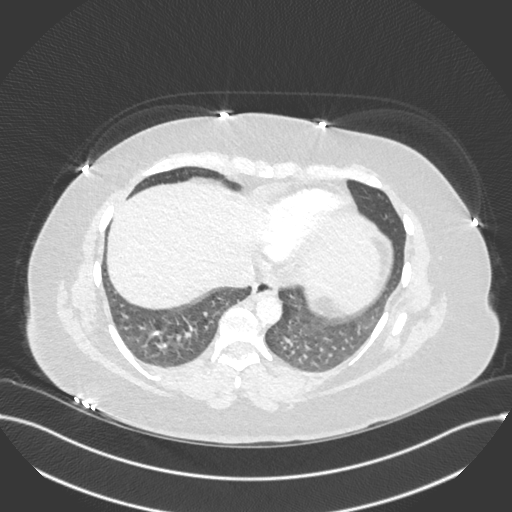
[im 258/465  soft-tissue]
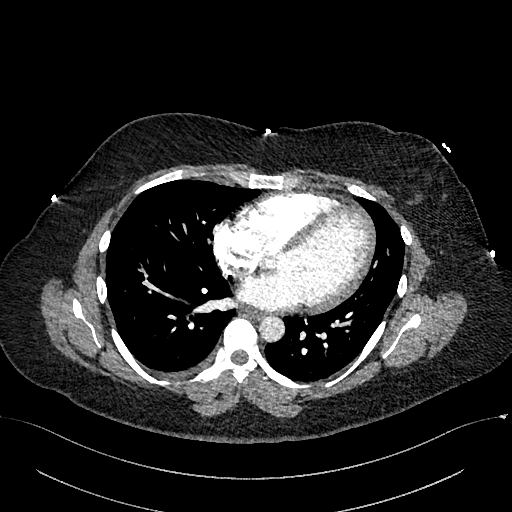
[im 284/465  lung]
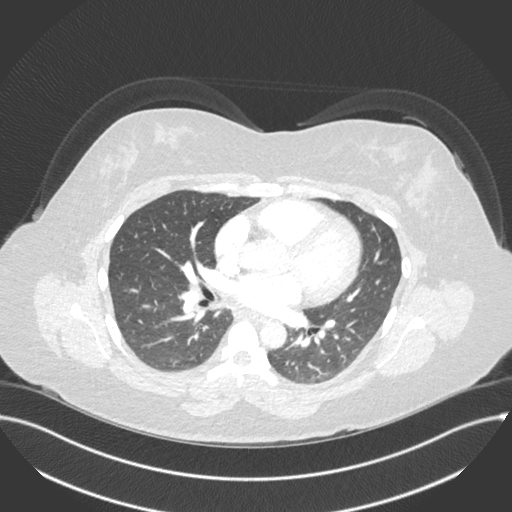
[im 310/465  soft-tissue]
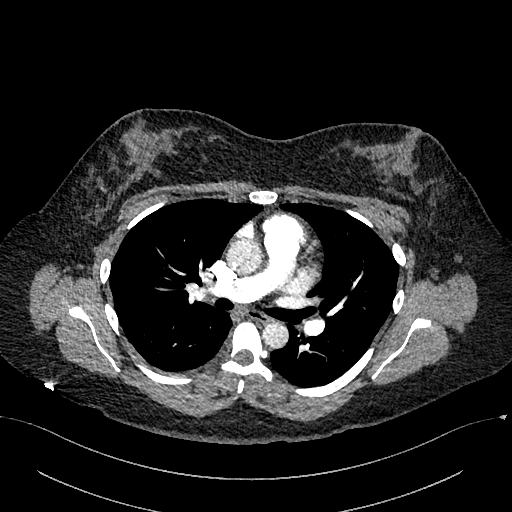
[im 336/465  lung]
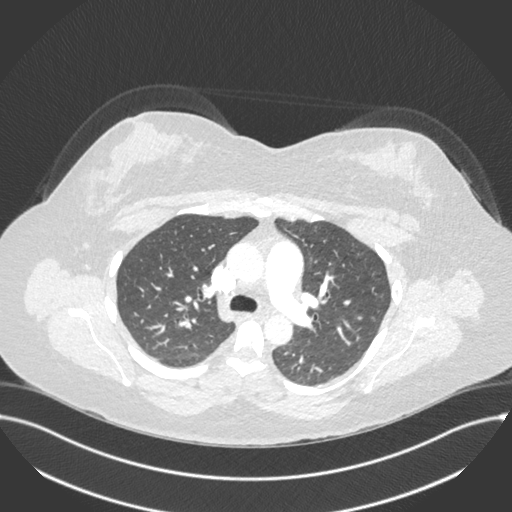
[im 361/465  soft-tissue]
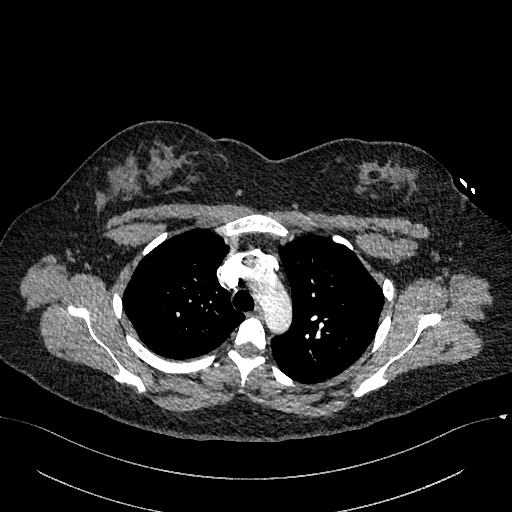
[im 413/465  lung]
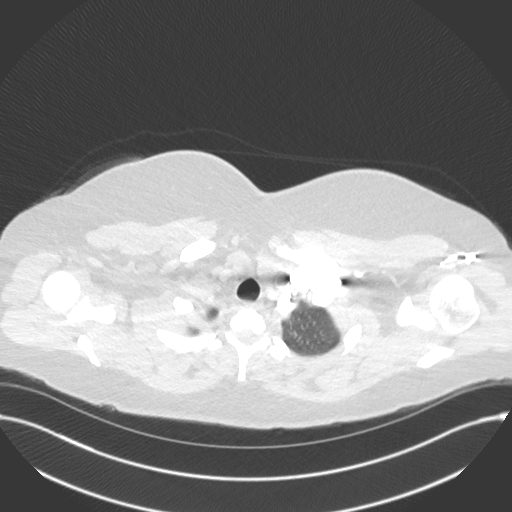
[im 439/465  soft-tissue]
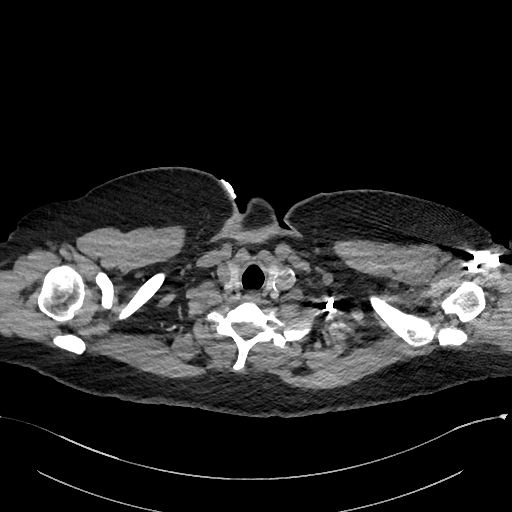

[Series 8: cor · coronal · 0.66mm/px · 3 of 159 slices shown]
[im 40/159  soft-tissue]
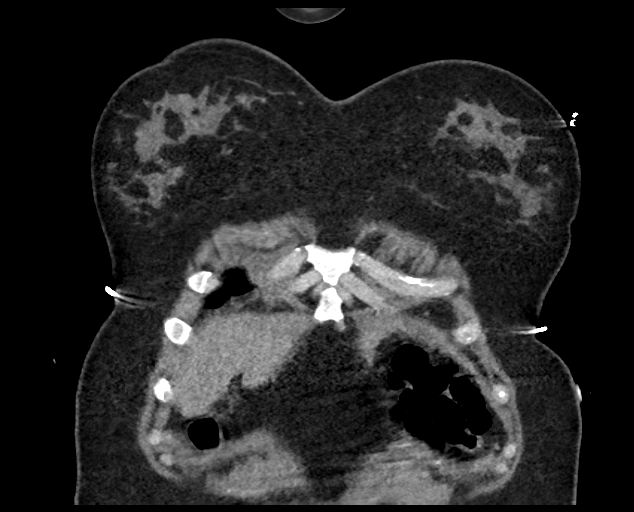
[im 80/159  soft-tissue]
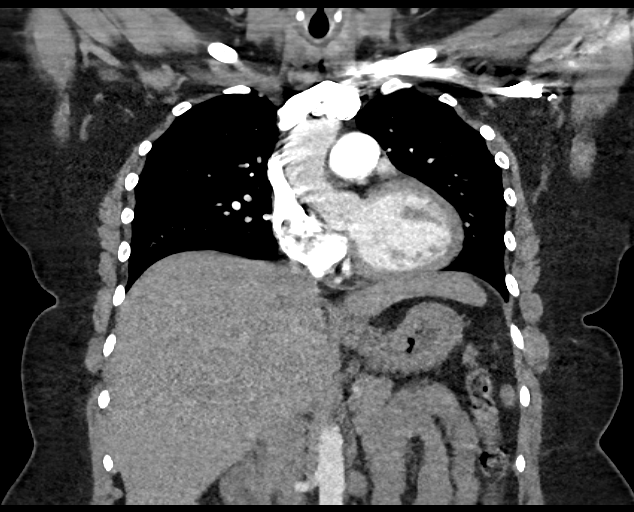
[im 119/159  soft-tissue]
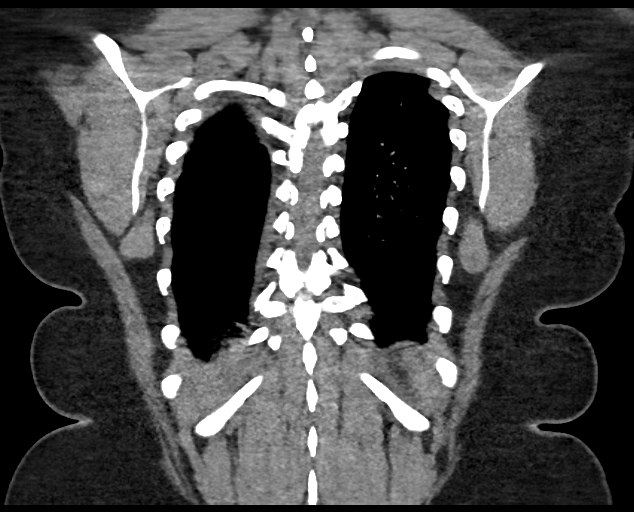

[17 of 46 positions shown; findings below may reference images not displayed]

FINDINGS: Cardiovascular: Good contrast bolus timing in the pulmonary arterial
tree. Respiratory motion artifact in the basilar segments of the
lower lobes.

No central or saddle embolus. No pulmonary artery filling defect
identified in the left lung. But there is a positive filling defect
in the right upper lobe branch seen on series 7, image 132 and
series 11, image 82.

Negative visible aorta. Borderline to mild cardiomegaly. No
pericardial effusion.

Mediastinum/Nodes: Negative.

Lungs/Pleura: Small layering right pleural effusion. Major airways
are patent. There is a small area of confluent ground-glass opacity
in the right costophrenic angle on series 6, image 109. No abnormal
right upper lobe opacity. Mild left lower lobe ground-glass opacity
is generalized and more resembles atelectasis.

Upper Abdomen: Surgically absent gallbladder. Negative visible
liver, spleen, pancreas, adrenal glands, kidneys, and bowel in the
upper abdomen.

Musculoskeletal: No acute osseous abnormality identified.

Review of the MIP images confirms the above findings.
IMPRESSION: 1. Positive for acute PE: Thrombus identified in the branch to the
right upper lobe. Although suggestion of a small pulmonary infarct
in the posterior basal segment raises the possibility of additional
clot in the right lower lobe.
2. Small layering right pleural effusion.

ADDENDUM:
Critical Value/emergent results were called by telephone at the time
of interpretation on 03/16/2019 at [DATE] to PA GARGI TIGER , who
verbally acknowledged these results.

*** End of Addendum ***
FINDINGS: Cardiovascular: Good contrast bolus timing in the pulmonary arterial
tree. Respiratory motion artifact in the basilar segments of the
lower lobes.

No central or saddle embolus. No pulmonary artery filling defect
identified in the left lung. But there is a positive filling defect
in the right upper lobe branch seen on series 7, image 132 and
series 11, image 82.

Negative visible aorta. Borderline to mild cardiomegaly. No
pericardial effusion.

Mediastinum/Nodes: Negative.

Lungs/Pleura: Small layering right pleural effusion. Major airways
are patent. There is a small area of confluent ground-glass opacity
in the right costophrenic angle on series 6, image 109. No abnormal
right upper lobe opacity. Mild left lower lobe ground-glass opacity
is generalized and more resembles atelectasis.

Upper Abdomen: Surgically absent gallbladder. Negative visible
liver, spleen, pancreas, adrenal glands, kidneys, and bowel in the
upper abdomen.

Musculoskeletal: No acute osseous abnormality identified.

Review of the MIP images confirms the above findings.
IMPRESSION: 1. Positive for acute PE: Thrombus identified in the branch to the
right upper lobe. Although suggestion of a small pulmonary infarct
in the posterior basal segment raises the possibility of additional
clot in the right lower lobe.
2. Small layering right pleural effusion.

## 2022-09-02 NOTE — L&D Delivery Note (Signed)
Delivery Note Labor onset: 02/15/2023  Labor Onset Time: 0234 Complete dilation at 3:24 AM  Onset of pushing at 0324 FHR second stage Cat 1 Analgesia/Anesthesia intrapartum: IV sedation  Newborn delivered in bed over one, involuntary, forceful push with RN as CNM walked into the room. Precipitous delivery of a viable female at 43. Lusty cry immediately at birth.  Nuchal cord: none.  Infant placed on maternal abd, dried, and tactile stim.  Cord double clamped after 4 min and cut by father, Onalee Hua.  RN x2 present for birth.  Cord blood sample collected: Yes Arterial cord blood sample collected: No  Placenta delivered Tomasa Blase, intact, with 3 VC.  Placenta to L&D. Uterine tone firm, bleeding scant  No laceration identified.  Anesthesia: none Repair: N/A QBL/EBL (mL): 100 Complications: none APGAR: APGAR (1 MIN): 9   APGAR (5 MINS): 9   APGAR (10 MINS):   Mom to postpartum.  Baby to Couplet care / Skin to Skin. Circumcision Yes  Roma Schanz DNP, CNM 02/15/2023, 4:22 AM

## 2022-10-17 ENCOUNTER — Other Ambulatory Visit: Payer: Self-pay | Admitting: Obstetrics and Gynecology

## 2022-10-17 DIAGNOSIS — Z363 Encounter for antenatal screening for malformations: Secondary | ICD-10-CM

## 2022-10-24 ENCOUNTER — Encounter: Payer: Self-pay | Admitting: *Deleted

## 2022-10-29 ENCOUNTER — Ambulatory Visit: Payer: No Typology Code available for payment source | Admitting: *Deleted

## 2022-10-29 ENCOUNTER — Ambulatory Visit
Payer: No Typology Code available for payment source | Attending: Maternal & Fetal Medicine | Admitting: Maternal & Fetal Medicine

## 2022-10-29 ENCOUNTER — Other Ambulatory Visit: Payer: Self-pay

## 2022-10-29 ENCOUNTER — Encounter: Payer: Self-pay | Admitting: *Deleted

## 2022-10-29 ENCOUNTER — Ambulatory Visit: Payer: No Typology Code available for payment source | Attending: Obstetrics and Gynecology

## 2022-10-29 DIAGNOSIS — Z86711 Personal history of pulmonary embolism: Secondary | ICD-10-CM | POA: Diagnosis not present

## 2022-10-29 DIAGNOSIS — Z3A22 22 weeks gestation of pregnancy: Secondary | ICD-10-CM

## 2022-10-29 DIAGNOSIS — O99212 Obesity complicating pregnancy, second trimester: Secondary | ICD-10-CM

## 2022-10-29 DIAGNOSIS — O09522 Supervision of elderly multigravida, second trimester: Secondary | ICD-10-CM

## 2022-10-29 DIAGNOSIS — O0990 Supervision of high risk pregnancy, unspecified, unspecified trimester: Secondary | ICD-10-CM | POA: Diagnosis not present

## 2022-10-29 DIAGNOSIS — Z363 Encounter for antenatal screening for malformations: Secondary | ICD-10-CM | POA: Diagnosis present

## 2022-10-29 DIAGNOSIS — O2230 Deep phlebothrombosis in pregnancy, unspecified trimester: Secondary | ICD-10-CM

## 2022-10-29 NOTE — Progress Notes (Signed)
MFM Consult Note Patient Name: Kathleen Mason  Patient MRN:   [id number]  Referring provider: CCIB  Reason for Consult: History of DVT and PE  HPI: Kathleen Mason is a 37 y.o. G3P2002 at 21w1dhere for ultrasound and consultation.   RE history of DVT and PE: The patient has a history of a DVT and PE that resulted after she had knee surgery and was on OCPs.  She was on Xarelto for 3 months and then discontinued.  She is currently taking Lovenox 30 mg twice a day for DVT prophylaxis during pregnancy.  She was not recommended to have a thrombophilia workup since this was thought to be a previously provoked PE and DVT.  I discussed that since there is a chance the previous thromboembolic events resulted from birth control pills it is possible that her pregnancy would increase her risk of a DVT during pregnancy which is already increased at baseline.  She has had no difficulty with the Lovenox injections.  We discussed the importance of compliance and discontinuing Lovenox approximately 24 hours prior to her delivery to allow for neuraxial analgesia and minimize bleeding at the time of delivery.  I also discussed the highest risk time is postpartum for DVT or PE.  She will resume her anticoagulation likely 6 to 12 hours after her delivery depending on if she has neuraxial analgesia.  Review of Systems: A review of systems was performed and was negative except per HPI   Vitals and Physical Exam BP 111/74, HR 72, pregravid BMI: 35.  Sitting comfortably on the sonogram table Nonlabored breathing Normal rate and rhythm Abdomen is nontender  Genetic testing: Declined  Sonographic findings Single intrauterine pregnancy. Fetal cardiac activity:  Observed and appears normal. Presentation: Cephalic. The anatomic structures that were well seen appear normal without evidence of soft markers. Due to poor acoustic windows, the visualization some structures remain suboptimally seen. Fetal biometry shows the  estimated fetal weight at the 29 percentile. Amniotic fluid volume: Within normal limits. MVP: 4.98 cm. Placenta: Anterior.  Assessment -History of DVT and PE, provoked by knee surgery and OCPs Plan -Because her cycles are irregular the LMP is unreliable for dating. She will be dated on the prior ultrasound with a due date of 03/03/2023. -Continue Lovenox 30 mg twice daily.  We can hold approximately 24 hours prior to her delivery. -Follow-up ultrasound in 4 to 6 weeks to complete fetal anatomy and assess fetal growth.  I spent 30 minutes reviewing the patients chart, including labs and images as well as counseling the patient about her medical conditions.  BValeda Malm MFM, [place]  10/29/2022  10:02 AM

## 2022-12-04 ENCOUNTER — Ambulatory Visit: Payer: No Typology Code available for payment source | Attending: Maternal & Fetal Medicine

## 2022-12-04 ENCOUNTER — Other Ambulatory Visit: Payer: Self-pay | Admitting: *Deleted

## 2022-12-04 ENCOUNTER — Ambulatory Visit: Payer: No Typology Code available for payment source | Admitting: *Deleted

## 2022-12-04 VITALS — BP 106/62 | HR 68

## 2022-12-04 DIAGNOSIS — E669 Obesity, unspecified: Secondary | ICD-10-CM

## 2022-12-04 DIAGNOSIS — R638 Other symptoms and signs concerning food and fluid intake: Secondary | ICD-10-CM

## 2022-12-04 DIAGNOSIS — Z86718 Personal history of other venous thrombosis and embolism: Secondary | ICD-10-CM

## 2022-12-04 DIAGNOSIS — O99212 Obesity complicating pregnancy, second trimester: Secondary | ICD-10-CM

## 2022-12-04 DIAGNOSIS — Z7901 Long term (current) use of anticoagulants: Secondary | ICD-10-CM

## 2022-12-04 DIAGNOSIS — O09522 Supervision of elderly multigravida, second trimester: Secondary | ICD-10-CM

## 2022-12-04 DIAGNOSIS — O2230 Deep phlebothrombosis in pregnancy, unspecified trimester: Secondary | ICD-10-CM

## 2022-12-04 DIAGNOSIS — Z3A27 27 weeks gestation of pregnancy: Secondary | ICD-10-CM | POA: Diagnosis not present

## 2023-01-22 ENCOUNTER — Ambulatory Visit: Payer: No Typology Code available for payment source

## 2023-02-14 ENCOUNTER — Other Ambulatory Visit: Payer: Self-pay

## 2023-02-14 ENCOUNTER — Encounter (HOSPITAL_COMMUNITY): Payer: Self-pay | Admitting: Obstetrics & Gynecology

## 2023-02-14 ENCOUNTER — Inpatient Hospital Stay (HOSPITAL_COMMUNITY)
Admission: AD | Admit: 2023-02-14 | Discharge: 2023-02-16 | DRG: 807 | Disposition: A | Discharge status: Home / Self Care | Payer: 59 | Attending: Obstetrics & Gynecology | Admitting: Obstetrics & Gynecology

## 2023-02-14 DIAGNOSIS — O99214 Obesity complicating childbirth: Secondary | ICD-10-CM | POA: Diagnosis present

## 2023-02-14 DIAGNOSIS — Z7901 Long term (current) use of anticoagulants: Secondary | ICD-10-CM

## 2023-02-14 DIAGNOSIS — O368130 Decreased fetal movements, third trimester, not applicable or unspecified: Secondary | ICD-10-CM | POA: Diagnosis present

## 2023-02-14 DIAGNOSIS — O368390 Maternal care for abnormalities of the fetal heart rate or rhythm, unspecified trimester, not applicable or unspecified: Secondary | ICD-10-CM | POA: Diagnosis present

## 2023-02-14 DIAGNOSIS — O368330 Maternal care for abnormalities of the fetal heart rate or rhythm, third trimester, not applicable or unspecified: Secondary | ICD-10-CM | POA: Diagnosis present

## 2023-02-14 DIAGNOSIS — O24420 Gestational diabetes mellitus in childbirth, diet controlled: Secondary | ICD-10-CM | POA: Diagnosis present

## 2023-02-14 DIAGNOSIS — Z86711 Personal history of pulmonary embolism: Secondary | ICD-10-CM

## 2023-02-14 DIAGNOSIS — Z86718 Personal history of other venous thrombosis and embolism: Secondary | ICD-10-CM | POA: Diagnosis not present

## 2023-02-14 DIAGNOSIS — O163 Unspecified maternal hypertension, third trimester: Secondary | ICD-10-CM | POA: Diagnosis not present

## 2023-02-14 DIAGNOSIS — Z3A37 37 weeks gestation of pregnancy: Principal | ICD-10-CM

## 2023-02-14 HISTORY — DX: Gestational diabetes mellitus in pregnancy, unspecified control: O24.419

## 2023-02-14 LAB — COMPREHENSIVE METABOLIC PANEL
ALT: 24 U/L (ref 0–44)
AST: 19 U/L (ref 15–41)
Albumin: 2.5 g/dL — ABNORMAL LOW (ref 3.5–5.0)
Alkaline Phosphatase: 106 U/L (ref 38–126)
Anion gap: 8 (ref 5–15)
BUN: 10 mg/dL (ref 6–20)
CO2: 20 mmol/L — ABNORMAL LOW (ref 22–32)
Calcium: 8.4 mg/dL — ABNORMAL LOW (ref 8.9–10.3)
Chloride: 107 mmol/L (ref 98–111)
Creatinine, Ser: 0.73 mg/dL (ref 0.44–1.00)
GFR, Estimated: >60 mL/min (ref >60)
Glucose, Bld: 95 mg/dL (ref 70–99)
Potassium: 3.9 mmol/L (ref 3.5–5.1)
Sodium: 135 mmol/L (ref 135–145)
Total Bilirubin: 0.5 mg/dL (ref 0.3–1.2)
Total Protein: 6.1 g/dL — ABNORMAL LOW (ref 6.5–8.1)

## 2023-02-14 LAB — CBC
HCT: 30.7 % — ABNORMAL LOW (ref 36.0–46.0)
Hemoglobin: 10.2 g/dL — ABNORMAL LOW (ref 12.0–15.0)
MCH: 29.1 pg (ref 26.0–34.0)
MCHC: 33.2 g/dL (ref 30.0–36.0)
MCV: 87.5 fL (ref 80.0–100.0)
Platelets: 180 10*3/uL (ref 150–400)
RBC: 3.51 MIL/uL — ABNORMAL LOW (ref 3.87–5.11)
RDW: 15.6 % — ABNORMAL HIGH (ref 11.5–15.5)
WBC: 8.4 10*3/uL (ref 4.0–10.5)
nRBC: 0 % (ref 0.0–0.2)

## 2023-02-14 LAB — GLUCOSE, CAPILLARY
Glucose-Capillary: 95 mg/dL (ref 70–99)
Glucose-Capillary: 79 mg/dL (ref 70–99)

## 2023-02-14 LAB — HIV ANTIBODY (ROUTINE TESTING W REFLEX): HIV Screen 4th Generation wRfx: NONREACTIVE

## 2023-02-14 LAB — PROTEIN / CREATININE RATIO, URINE
Creatinine, Urine: 160 mg/dL
Protein Creatinine Ratio: 0.26 mg/mg{Cre} — ABNORMAL HIGH (ref 0.00–0.15)
Total Protein, Urine: 41 mg/dL

## 2023-02-14 LAB — TYPE AND SCREEN
ABO/RH(D): O POS
Antibody Screen: NEGATIVE

## 2023-02-14 MED ORDER — TERBUTALINE SULFATE 1 MG/ML IJ SOLN: 0.2500 mg | Freq: Once | INTRAMUSCULAR | Status: DC | PRN

## 2023-02-14 MED ORDER — SOD CITRATE-CITRIC ACID 500-334 MG/5ML PO SOLN: 30.0000 mL | ORAL | Status: DC | PRN

## 2023-02-14 MED ORDER — FLEET ENEMA 7-19 GM/118ML RE ENEM: 1.0000 | ENEMA | RECTAL | Status: DC | PRN

## 2023-02-14 MED ORDER — LACTATED RINGERS IV SOLN: 500.0000 mL | INTRAVENOUS | Status: DC | PRN

## 2023-02-14 MED ORDER — ACETAMINOPHEN 325 MG PO TABS: 650.0000 mg | ORAL_TABLET | ORAL | Status: DC | PRN

## 2023-02-14 MED ORDER — OXYTOCIN-SODIUM CHLORIDE 30-0.9 UT/500ML-% IV SOLN
1.0000 m[IU]/min | INTRAVENOUS | Status: DC
  Administered 2023-02-14: 2 m[IU]/min via INTRAVENOUS
  Filled 2023-02-14: qty 500

## 2023-02-14 MED ORDER — LIDOCAINE HCL (PF) 1 % IJ SOLN: 30.0000 mL | INTRAMUSCULAR | Status: DC | PRN

## 2023-02-14 MED ORDER — OXYTOCIN BOLUS FROM INFUSION
333.0000 mL | Freq: Once | INTRAVENOUS | Status: AC
  Administered 2023-02-15: 300 mL via INTRAVENOUS

## 2023-02-14 MED ORDER — FENTANYL CITRATE (PF) 100 MCG/2ML IJ SOLN
50.0000 ug | INTRAMUSCULAR | Status: DC | PRN
  Administered 2023-02-15 (×2): 100 ug via INTRAVENOUS
  Filled 2023-02-14 (×2): qty 2

## 2023-02-14 MED ORDER — ONDANSETRON HCL 4 MG/2ML IJ SOLN: 4.0000 mg | Freq: Four times a day (QID) | INTRAMUSCULAR | Status: DC | PRN

## 2023-02-14 MED ORDER — LACTATED RINGERS IV SOLN: INTRAVENOUS | Status: DC

## 2023-02-14 MED ORDER — OXYTOCIN-SODIUM CHLORIDE 30-0.9 UT/500ML-% IV SOLN
2.5000 [IU]/h | INTRAVENOUS | Status: DC
  Administered 2023-02-15: 2.5 [IU]/h via INTRAVENOUS

## 2023-02-14 NOTE — MAU Provider Note (Signed)
History     CSN: [id number]  Arrival date and time: 02/14/23 1606   Event Date/Time   First Provider Initiated Contact with Patient 02/14/23 1645      Chief Complaint  Patient presents with   Decreased Fetal Movement   37 y.o. G3P2002 @37 .4 wks sent from office for non-reactive NST and decreased FM. Reports only feeling 1-2 FMs today. Denies HA, visual disturbances, RUQ pain, SOB, and CP. Reports one elevated BP in the office on a previous visit. Of note her pregnancy is complicated by AMA, obesity, hx DVT/PE-on Lovenox, and A1GDM.     OB History     Gravida  3   Para  2   Term  2   Preterm      AB      Living  2      SAB      IAB      Ectopic      Multiple      Live Births  1           Past Medical History:  Diagnosis Date   DVT (deep venous thrombosis) (HCC)    While on BCP's   Gestational diabetes    No pertinent past medical history    Torn ACL    Torn meniscus     Past Surgical History:  Procedure Laterality Date   ANTERIOR CRUCIATE LIGAMENT REPAIR     GALLBLADDER SURGERY  2004   MENISCUS REPAIR      Family History  Problem Relation Age of Onset   Diabetes Paternal Grandmother    Diabetes Mother    Diabetes Father     Social History   Tobacco Use   Smoking status: Never   Smokeless tobacco: Never  Vaping Use   Vaping Use: Never used  Substance Use Topics   Alcohol use: Not Currently    Alcohol/week: 2.0 standard drinks of alcohol    Types: 2 Standard drinks or equivalent per week   Drug use: No    Allergies: No Known Allergies  Medications Prior to Admission  Medication Sig Dispense Refill Last Dose   enoxaparin (LOVENOX) 30 MG/0.3ML injection Inject 30 mg into the skin every 12 (twelve) hours.   02/14/2023   Prenatal Vit-Fe Fumarate-FA (MULTIVITAMIN-PRENATAL) 27-0.8 MG TABS tablet Take 1 tablet by mouth daily at 12 noon.   02/13/2023   VITAMIN D PO Take by mouth.   02/13/2023    Review of Systems  Eyes:  Negative  for visual disturbance.  Respiratory:  Negative for shortness of breath.   Cardiovascular:  Negative for chest pain.  Gastrointestinal:  Negative for abdominal pain.  Neurological:  Negative for headaches.   Physical Exam   Blood pressure (!) 148/90, pulse 66, temperature 98.3 F (36.8 C), resp. rate 16, height 5\' 5"  (1.651 m), weight 103.6 kg, last menstrual period 05/20/2022, SpO2 99 %. Patient Vitals for the past 24 hrs:  BP Temp Pulse Resp SpO2 Height Weight  02/14/23 1801 (!) 148/90 -- 66 -- 99 % -- --  02/14/23 1716 (!) 142/79 -- 68 -- 98 % -- --  02/14/23 1702 134/82 -- 70 -- -- -- --  02/14/23 1701 -- -- -- -- 99 % -- --  02/14/23 1656 -- -- -- -- 99 % -- --  02/14/23 1651 -- -- -- -- 98 % -- --  02/14/23 1646 (!) 145/85 -- 68 -- 99 % -- --  02/14/23 1641 -- -- -- -- 98 % -- --  02/14/23 1636 -- -- -- -- 98 % -- --  02/14/23 1631 (!) 142/87 -- 70 -- 99 % -- --  02/14/23 1626 -- -- -- -- 99 % -- --  02/14/23 1624 (!) 141/84 -- 69 -- -- -- --  02/14/23 1623 (!) 141/84 98.3 F (36.8 C) 67 16 98 % -- --  02/14/23 1615 -- -- -- -- -- 5\' 5"  (1.651 m) 103.6 kg    Physical Exam Vitals and nursing note reviewed.  Constitutional:      General: She is not in acute distress.    Appearance: Normal appearance.  HENT:     Head: Normocephalic and atraumatic.  Cardiovascular:     Rate and Rhythm: Normal rate.  Abdominal:     Tenderness: There is no abdominal tenderness.  Musculoskeletal:        General: Normal range of motion.     Cervical back: Normal range of motion.  Skin:    General: Skin is warm and dry.  Neurological:     General: No focal deficit present.     Mental Status: She is alert.  Psychiatric:        Mood and Affect: Mood normal.        Behavior: Behavior normal.   EFM: 145 bpm, mod variability, + accels, occ variable decels Toco: rare  Limited bedside US: cephalic   Results for orders placed or performed during the hospital encounter of 02/14/23 (from  the past 24 hour(s))  CBC     Status: Abnormal   Collection Time: 02/14/23  5:03 PM  Result Value Ref Range   WBC 8.4 4.0 - 10.5 K/uL   RBC 3.51 (L) 3.87 - 5.11 MIL/uL   Hemoglobin 10.2 (L) 12.0 - 15.0 g/dL   HCT 16.1 (L) 09.6 - 04.5 %   MCV 87.5 80.0 - 100.0 fL   MCH 29.1 26.0 - 34.0 pg   MCHC 33.2 30.0 - 36.0 g/dL   RDW 40.9 (H) 81.1 - 91.4 %   Platelets 180 150 - 400 K/uL   nRBC 0.0 0.0 - 0.2 %  Comprehensive metabolic panel     Status: Abnormal   Collection Time: 02/14/23  5:03 PM  Result Value Ref Range   Sodium 135 135 - 145 mmol/L   Potassium 3.9 3.5 - 5.1 mmol/L   Chloride 107 98 - 111 mmol/L   CO2 20 (L) 22 - 32 mmol/L   Glucose, Bld 95 70 - 99 mg/dL   BUN 10 6 - 20 mg/dL   Creatinine, Ser 7.82 0.44 - 1.00 mg/dL   Calcium 8.4 (L) 8.9 - 10.3 mg/dL   Total Protein 6.1 (L) 6.5 - 8.1 g/dL   Albumin 2.5 (L) 3.5 - 5.0 g/dL   AST 19 15 - 41 U/L   ALT 24 0 - 44 U/L   Alkaline Phosphatase 106 38 - 126 U/L   Total Bilirubin 0.5 0.3 - 1.2 mg/dL   GFR, Estimated >95 >62 mL/min   Anion gap 8 5 - 15  Protein / creatinine ratio, urine     Status: Abnormal   Collection Time: 02/14/23  5:25 PM  Result Value Ref Range   Creatinine, Urine 160 mg/dL   Total Protein, Urine 41 mg/dL   Protein Creatinine Ratio 0.26 (H) 0.00 - 0.15 mg/mg[Cre]   MAU Course  Procedures  MDM PEC labs ordered. Prenatal records reviewed. Discussed presentation and clinical findings and rec admit with Dr. Sallye Ober, agrees with plan.   Assessment and Plan  1. [redacted] weeks gestation of pregnancy   2. Decreased fetal movements in third trimester, single or unspecified fetus   3. Elevated blood pressure affecting pregnancy in third trimester, antepartum    Admit to LD Mngt per primary [facility] provider  Donette Larry, CNM 02/14/2023, 6:10 PM

## 2023-02-14 NOTE — Plan of Care (Signed)

## 2023-02-14 NOTE — H&P (Signed)
OB ADMISSION/ HISTORY & PHYSICAL:  Admission Date: 02/14/2023  4:06 PM  Admit Diagnosis: Non-reassuring FHR affecting care of mother  Kathleen Mason is a 37 y.o. female G3P2002 [redacted]w[redacted]d presented to MAU from the office for a NR NST. NST in MAU was reactive, but pt reports decreased fetal movement. IOL was recommended. Pt admitted to L&D for induction. Denies LOF and vaginal bleeding.  Pregnancy complicated by A1DM, AMA, hx of DVT (treated with Lovenox this pregnancy).   History of current pregnancy: [id number]   Patient entered care with [facility] at 12+2 wks.   EDC 7/1 by US  Anatomy scan:  complete w/ anterior placenta.   Antenatal testing: for A1DM started at 32 weeks Last evaluation: 37+2 wks EFW 6+2 (25%), cephaliv, BPP 8/8, anterior placenta, AFI Significant prenatal events:  Patient Active Problem List   Diagnosis Date Noted   Non-reassuring fetal heart rate or rhythm affecting management of mother 02/14/2023   DVT of lower limb, acute (HCC) 04/02/2019   History of pulmonary embolism 03/25/2019   Acute pulmonary embolism (HCC) 03/16/2019   Vitamin D deficiency 06/05/2017    Prenatal Labs: ABO, Rh: --/--/O POS (06/14 1800) Antibody: NEG (06/14 1800) Rubella:   immune RPR:   NR HBsAg:   NR HIV:   NR GTT: failed 3 hr GBS:   neg GC/CHL: neg/neg Genetics: declined Vaccines: Tdap: current    OB History  Gravida Para Term Preterm AB Living  3 2 2     2   SAB IAB Ectopic Multiple Live Births          1    # Outcome Date GA Lbr Len/2nd Weight Sex Delivery Anes PTL Lv  3 Current           2 Term 07/07/11 [redacted]w[redacted]d 05:55 / 00:04 2365 g M Vag-Spont None  LIV  1 Term 11/19/07    [facility] Vag-Spont       Medical / Surgical History: Past medical history:  Past Medical History:  Diagnosis Date   DVT (deep venous thrombosis) (HCC)    While on BCP's   Gestational diabetes    No pertinent past medical history    Torn ACL    Torn meniscus     Past surgical history:  Past Surgical History:   Procedure Laterality Date   ANTERIOR CRUCIATE LIGAMENT REPAIR     GALLBLADDER SURGERY  2004   JOINT REPLACEMENT     MENISCUS REPAIR     Family History:  Family History  Problem Relation Age of Onset   Diabetes Paternal Grandmother    Diabetes Mother    Diabetes Father     Social History:  reports that she has never smoked. She has never used smokeless tobacco. She reports that she does not currently use alcohol after a past usage of about 2.0 standard drinks of alcohol per week. She reports that she does not use drugs.  Allergies: Patient has no known allergies.   Current Medications at time of admission:  Prior to Admission medications   Medication Sig Start Date End Date Taking? Authorizing Provider  enoxaparin (LOVENOX) 30 MG/0.3ML injection Inject 30 mg into the skin every 12 (twelve) hours.   Yes [provider]  Prenatal Vit-Fe Fumarate-FA (MULTIVITAMIN-PRENATAL) 27-0.8 MG TABS tablet Take 1 tablet by mouth daily at 12 noon.   Yes [provider]  VITAMIN D PO Take by mouth.   Yes [provider]    Review of Systems: Constitutional: Negative   HENT: Negative  Eyes: Negative   Respiratory: Negative   Cardiovascular: Negative   Gastrointestinal: Negative  Genitourinary: neg for bloody show, neg for LOF   Musculoskeletal: Negative   Skin: Negative   Neurological: Negative   Endo/Heme/Allergies: Negative   Psychiatric/Behavioral: Negative    Physical Exam: VS: Blood pressure (!) 161/99, pulse 69, temperature 98 F (36.7 C), temperature source Oral, resp. rate 17, height 5\' 5"  (1.651 m), weight 103.4 kg, last menstrual period 05/20/2022, SpO2 99 %. AAO x3, no signs of distress Cardiovascular: RRR Respiratory: Unlabored GU/GI: Abdomen gravid, non-tender, non-distended, active FM, vertex, EFW 6+6 per Leopold's Extremities: no edema, negative for pain, tenderness, and cords  Cervical exam:Dilation: 2 Effacement (%): 60 Station: -3 Exam  by:: V.Deryl Ports, CNM FHR: baseline rate 140 / variability moderate / accelerations present / absent decelerations TOCO: occasional   Prenatal Transfer Tool  Maternal Diabetes: Yes:  Diabetes Type:  Diet controlled Genetic Screening: Declined Maternal Ultrasounds/Referrals: Normal Fetal Ultrasounds or other Referrals:  None Maternal Substance Abuse:  No Significant Maternal Medications:  Meds include: Other: Lovenox Significant Maternal Lab Results: Group B Strep negative Number of Prenatal Visits:greater than 3 verified prenatal visits Other Comments:  None    Assessment: 37 y.o. G3P2002 [redacted]w[redacted]d NRFHR affecting maternal management Decreased FM AMA Hx of DVT - managed on Lovenox A1GDM  Induction of labor FHR category 1 GBS neg Pain management plan: unmedicated   Plan:  Admit to L&D Routine admission orders Epidural PRN Pitocin CBG Q4 hrs Dr Sallye Ober notified by MAU provider RE: recommendation for admission and delivery  Roma Schanz DNP, CNM 02/14/2023 6:42 PM

## 2023-02-14 NOTE — MAU Note (Signed)
Kathleen Mason is a 37 y.o. at [redacted]w[redacted]d here in MAU reporting: baby was really active yesterday, but not moving as much today.  Reports that they had the baby on the monitor at the office, wasn't moving and "I guess they didn't see what they wanted", told me to come here.  Onset of complaint: today Pain score: none There were no vitals filed for this visit.   ZOX:WRUEAV, direct to rm Lab orders placed from triage:

## 2023-02-15 ENCOUNTER — Encounter (HOSPITAL_COMMUNITY): Payer: Self-pay | Admitting: Obstetrics & Gynecology

## 2023-02-15 LAB — CBC
HCT: 30.6 % — ABNORMAL LOW (ref 36.0–46.0)
Hemoglobin: 10.2 g/dL — ABNORMAL LOW (ref 12.0–15.0)
MCH: 29.8 pg (ref 26.0–34.0)
MCHC: 33.3 g/dL (ref 30.0–36.0)
MCV: 89.5 fL (ref 80.0–100.0)
Platelets: 160 10*3/uL (ref 150–400)
RBC: 3.42 MIL/uL — ABNORMAL LOW (ref 3.87–5.11)
RDW: 15.5 % (ref 11.5–15.5)
WBC: 16.2 10*3/uL — ABNORMAL HIGH (ref 4.0–10.5)
nRBC: 0 % (ref 0.0–0.2)

## 2023-02-15 LAB — GLUCOSE, CAPILLARY: Glucose-Capillary: 109 mg/dL — ABNORMAL HIGH (ref 70–99)

## 2023-02-15 LAB — RPR: RPR Ser Ql: NONREACTIVE

## 2023-02-15 MED ORDER — HYDRALAZINE HCL 20 MG/ML IJ SOLN: 10.0000 mg | INTRAMUSCULAR | Status: DC | PRN

## 2023-02-15 MED ORDER — SENNOSIDES-DOCUSATE SODIUM 8.6-50 MG PO TABS
2.0000 | ORAL_TABLET | ORAL | Status: DC
  Administered 2023-02-15 – 2023-02-16 (×3): 2 via ORAL
  Filled 2023-02-15 (×4): qty 2

## 2023-02-15 MED ORDER — DIBUCAINE (PERIANAL) 1 % EX OINT: 1.0000 | TOPICAL_OINTMENT | CUTANEOUS | Status: DC | PRN

## 2023-02-15 MED ORDER — ENOXAPARIN SODIUM 60 MG/0.6ML IJ SOSY
50.0000 mg | PREFILLED_SYRINGE | INTRAMUSCULAR | Status: DC
  Administered 2023-02-15 – 2023-02-16 (×2): 50 mg via SUBCUTANEOUS
  Filled 2023-02-15 (×2): qty 0.6

## 2023-02-15 MED ORDER — DIPHENHYDRAMINE HCL 25 MG PO CAPS: 25.0000 mg | ORAL_CAPSULE | Freq: Four times a day (QID) | ORAL | Status: DC | PRN

## 2023-02-15 MED ORDER — TETANUS-DIPHTH-ACELL PERTUSSIS 5-2.5-18.5 LF-MCG/0.5 IM SUSY: 0.5000 mL | PREFILLED_SYRINGE | Freq: Once | INTRAMUSCULAR | Status: DC

## 2023-02-15 MED ORDER — SIMETHICONE 80 MG PO CHEW: 80.0000 mg | CHEWABLE_TABLET | ORAL | Status: DC | PRN

## 2023-02-15 MED ORDER — WITCH HAZEL-GLYCERIN EX PADS: 1.0000 | MEDICATED_PAD | CUTANEOUS | Status: DC | PRN

## 2023-02-15 MED ORDER — LABETALOL HCL 5 MG/ML IV SOLN: 80.0000 mg | INTRAVENOUS | Status: DC | PRN

## 2023-02-15 MED ORDER — ONDANSETRON HCL 4 MG PO TABS: 4.0000 mg | ORAL_TABLET | ORAL | Status: DC | PRN

## 2023-02-15 MED ORDER — BENZOCAINE-MENTHOL 20-0.5 % EX AERO: 1.0000 | INHALATION_SPRAY | CUTANEOUS | Status: DC | PRN

## 2023-02-15 MED ORDER — COCONUT OIL OIL: 1.0000 | TOPICAL_OIL | Status: DC | PRN

## 2023-02-15 MED ORDER — LABETALOL HCL 5 MG/ML IV SOLN
40.0000 mg | INTRAVENOUS | Status: DC | PRN
  Administered 2023-02-15: 40 mg via INTRAVENOUS
  Filled 2023-02-15: qty 8

## 2023-02-15 MED ORDER — ONDANSETRON HCL 4 MG/2ML IJ SOLN: 4.0000 mg | INTRAMUSCULAR | Status: DC | PRN

## 2023-02-15 MED ORDER — ACETAMINOPHEN 325 MG PO TABS
650.0000 mg | ORAL_TABLET | ORAL | Status: DC | PRN
  Administered 2023-02-15 (×2): 650 mg via ORAL
  Filled 2023-02-15 (×2): qty 2

## 2023-02-15 MED ORDER — PRENATAL MULTIVITAMIN CH
1.0000 | ORAL_TABLET | Freq: Every day | ORAL | Status: DC
  Administered 2023-02-15 – 2023-02-16 (×2): 1 via ORAL
  Filled 2023-02-15 (×2): qty 1

## 2023-02-15 MED ORDER — ZOLPIDEM TARTRATE 5 MG PO TABS: 5.0000 mg | ORAL_TABLET | Freq: Every evening | ORAL | Status: DC | PRN

## 2023-02-15 MED ORDER — LABETALOL HCL 5 MG/ML IV SOLN
INTRAVENOUS | Status: AC
  Administered 2023-02-15: 20 mg via INTRAVENOUS
  Filled 2023-02-15: qty 4

## 2023-02-15 MED ORDER — IBUPROFEN 600 MG PO TABS
600.0000 mg | ORAL_TABLET | Freq: Four times a day (QID) | ORAL | Status: DC
  Administered 2023-02-15 – 2023-02-16 (×6): 600 mg via ORAL
  Filled 2023-02-15 (×6): qty 1

## 2023-02-15 MED ORDER — LABETALOL HCL 5 MG/ML IV SOLN: 20.0000 mg | INTRAVENOUS | Status: DC | PRN

## 2023-02-15 NOTE — Plan of Care (Signed)
  Problem: Education: Goal: Knowledge of Childbirth will improve 02/15/2023 0432 by Joretta Bachelor, RN Outcome: Completed/Met 02/14/2023 2028 by Joretta Bachelor, RN Outcome: Progressing Goal: Ability to make informed decisions regarding treatment and plan of care will improve 02/15/2023 0432 by Joretta Bachelor, RN Outcome: Completed/Met 02/14/2023 2028 by Joretta Bachelor, RN Outcome: Progressing Goal: Ability to state and carry out methods to decrease the pain will improve 02/15/2023 0432 by Joretta Bachelor, RN Outcome: Completed/Met 02/14/2023 2028 by Joretta Bachelor, RN Outcome: Progressing Goal: Individualized Educational Video(s) 02/15/2023 0432 by Joretta Bachelor, RN Outcome: Completed/Met 02/14/2023 2028 by Joretta Bachelor, RN Outcome: Progressing   Problem: Coping: Goal: Ability to verbalize concerns and feelings about labor and delivery will improve 02/15/2023 0432 by Joretta Bachelor, RN Outcome: Completed/Met 02/14/2023 2028 by Joretta Bachelor, RN Outcome: Progressing   Problem: Life Cycle: Goal: Ability to make normal progression through stages of labor will improve 02/15/2023 0432 by Joretta Bachelor, RN Outcome: Completed/Met 02/14/2023 2028 by Joretta Bachelor, RN Outcome: Progressing Goal: Ability to effectively push during vaginal delivery will improve 02/15/2023 0432 by Joretta Bachelor, RN Outcome: Completed/Met 02/14/2023 2028 by Joretta Bachelor, RN Outcome: Progressing   Problem: Role Relationship: Goal: Will demonstrate positive interactions with the child 02/15/2023 0432 by Joretta Bachelor, RN Outcome: Completed/Met 02/14/2023 2028 by Joretta Bachelor, RN Outcome: Progressing   Problem: Safety: Goal: Risk of complications during labor and delivery will decrease 02/15/2023 0432 by Joretta Bachelor, RN Outcome: Completed/Met 02/14/2023 2028 by Joretta Bachelor, RN Outcome: Progressing   Problem: Pain Management: Goal: Relief or control of pain  from uterine contractions will improve 02/15/2023 0432 by Joretta Bachelor, RN Outcome: Completed/Met 02/14/2023 2028 by Joretta Bachelor, RN Outcome: Progressing

## 2023-02-15 NOTE — Lactation Note (Signed)
This note was copied from a baby's chart. Lactation Consultation Note  Patient Name: Kathleen Mason Date: 02/15/2023 Age:39 hours Reason for consult: Initial assessment;Early term 37-38.6wks;Maternal endocrine disorder;Other (Comment);Infant < 6lbs (AMA)  Visited with family of 62 hours old ETI female; Kathleen Mason is a P3 and reports breastfeeding is going well, baby has latched to the breast already but she has also been supplementing with Similac 22 calorie formula due to baby's birth weight. Offered latch assistance but MOB voiced that baby is not due for a feeding yet, this LC suggested to add pumping to the feeding plan due to formula supplementation and to protect her supply, she agreed and started pumping during [facility] consultation; instructions, cleaning and storage were reviewed. MBU RN came to do baby's exam and he started showing feeding cues while Kathleen Mason was pumping, this LC took baby to breast per maternal request and he latched with very little difficulty, left couplet nursing when exiting the room. Reviewed normal ETI behavior, feeding cues, pumping schedule, lactogenesis II and supplementation and anticipatory guidelines.  Maternal Data Has patient been taught Hand Expression?: Yes Does the patient have breastfeeding experience prior to this delivery?: Yes How long did the patient breastfeed?: + 1 year  Feeding Mother's Current Feeding Choice: Breast Milk and Formula Nipple Type: Extra Slow Flow  Lactation Tools Discussed/Used Tools: Pump;Flanges Flange Size: 21 Breast pump type: Double-Electric Breast Pump Pump Education: Setup, frequency, and cleaning;Milk Storage Reason for Pumping: ETI < 6 lbs Pumping frequency: every 3 hours or whenever baby is getting a bottle  Interventions Interventions: Breast feeding basics reviewed;DEBP;Education;LC Services brochure  Discharge Pump: Manual (Hand pump at home from baby shower)  Plan Encouraged to continue taking baby  to breast at least 8 times/24 hours or sooner if feeding cues are present She'll start pumping every 3 hours or whenever baby is getting a bottle Family will continue supplementing with Similac 22 calorie formula   No other support person at this time. All questions and concerns answered, family to contact [facility] services PRN.  Consult Status Consult Status: Follow-up Date: 02/16/23 Follow-up type: In-patient   Kathleen Mason Kathleen Mason 02/15/2023, 2:56 PM

## 2023-02-15 NOTE — Progress Notes (Signed)
Subjective:    Breathing with contractions and using IV sedation for pain management.   Objective:    VS: BP (!) 143/73   Pulse (!) 128   Temp 97.9 F (36.6 C) (Oral)   Resp 18   Ht 5\' 5"  (1.651 m)   Wt 103.4 kg   LMP 05/20/2022 (Exact Date)   SpO2 99%   BMI 37.94 kg/m  FHR : baseline 135 / variability moderate / accelerations present / early decelerations Toco: contractions every 2 minutes  Membranes: AROM, scant clear Dilation: 6 Effacement (%): 90 Cervical Position: Posterior Station: 0 Presentation: Vertex Exam by:: Rhea Pink Pitocin 14 then reduced to 6 mU/min, then off for tachysystole CBG (last 3)  Recent Labs    02/14/23 1839 02/14/23 2204 02/15/23 0202  GLUCAP 95 79 109*     Assessment/Plan:   37 y.o. G3P2002 [redacted]w[redacted]d IOL for NR antenatal surveillance Decreased FM AMA A1DM  Labor: Progressing normally GHTN:  no signs or symptoms of toxicity and labs stable Fetal Wellbeing:  Category I Pain Control:  IV pain meds I/D:   GBS neg Anticipated MOD:  NSVD  Roma Schanz DNP, CNM 02/15/2023 2:47 AM

## 2023-02-16 LAB — GLUCOSE, CAPILLARY: Glucose-Capillary: 88 mg/dL (ref 70–99)

## 2023-02-16 MED ORDER — IBUPROFEN 600 MG PO TABS: 600.0000 mg | ORAL_TABLET | Freq: Four times a day (QID) | ORAL | 0 refills | Status: AC

## 2023-02-16 MED ORDER — ENOXAPARIN SODIUM 60 MG/0.6ML IJ SOSY: 50.0000 mg | PREFILLED_SYRINGE | INTRAMUSCULAR | 0 refills | Status: AC

## 2023-02-16 NOTE — Lactation Note (Signed)
This note was copied from a baby's chart. Lactation Consultation Note  Patient Name: Kathleen Mason [id number] Date: 02/16/2023 Age:39 hours Reason for consult: Follow-up assessment  LC in to room for follow up. Parents are ready to leave. LC reviewed local resources for support. Parents states they have no questions or concerns at this point. They will continue feeding plan as discussed during previous encounter.   Feeding Mother's Current Feeding Choice: Breast Milk and Formula Nipple Type: Extra Slow Flow  LATCH Score No latch observed during this encounter.   Interventions Interventions: Education;Breast feeding basics reviewed  Consult Status Consult Status: Complete Date: 02/16/23 Follow-up type: Call as needed    Jamiya Nims A Higuera Ancidey 02/16/2023, 1:19 PM

## 2023-02-16 NOTE — Discharge Summary (Signed)
Postpartum Discharge Summary  Date of Service updated***     Patient Name: Kathleen Mason DOB: 12/24/1985 MRN: [id number]  Date of admission: 02/14/2023 Delivery date:02/15/2023  Delivering provider: Rhea Pink B  Date of discharge: 02/16/2023  Admitting diagnosis: Non-reassuring fetal heart rate or rhythm affecting management of mother [O36.8390] Intrauterine pregnancy: [redacted]w[redacted]d     Secondary diagnosis:  Principal Problem:   Non-reassuring fetal heart rate or rhythm affecting management of mother  Additional problems: ***    Discharge diagnosis: {DX.:23714}                                              Post partum procedures:{Postpartum procedures:23558} Augmentation: {Augmentation:20782} Complications: {OB Labor/Delivery Complications:20784}  Hospital course: {Courses:23701}  Magnesium Sulfate received: {Mag received:30440022} BMZ received: {BMZ received:30440023} Rhophylac:{Rhophylac received:30440032} WUJ:{WJX:91478295} T-DaP:{Tdap:23962} Flu: {AOZ:30865} Transfusion:{Transfusion received:30440034}  Physical exam  Vitals:   02/15/23 1315 02/15/23 1424 02/15/23 2110 02/16/23 0500  BP: 118/75 118/66 131/69 124/65  Pulse: 66 73 77 79  Resp: 16 17 18 16   Temp: 98.4 F (36.9 C) 98.4 F (36.9 C) 98.6 F (37 C) 98.4 F (36.9 C)  TempSrc: Oral Oral Axillary Oral  SpO2: 99% 98% 100% 99%  Weight:      Height:       General: {Exam; general:21111117} Lochia: {Desc; appropriate/inappropriate:30686::"appropriate"} Uterine Fundus: {Desc; firm/soft:30687} Incision: {Exam; incision:21111123} DVT Evaluation: {Exam; dvt:2111122} Labs: Lab Results  Component Value Date   WBC 16.2 (H) 02/15/2023   HGB 10.2 (L) 02/15/2023   HCT 30.6 (L) 02/15/2023   MCV 89.5 02/15/2023   PLT 160 02/15/2023      Latest Ref Rng & Units 02/14/2023    5:03 PM  CMP  Glucose 70 - 99 mg/dL 95   BUN 6 - 20 mg/dL 10   Creatinine 7.84 - 1.00 mg/dL 6.96   Sodium 295 - 284 mmol/L 135    Potassium 3.5 - 5.1 mmol/L 3.9   Chloride 98 - 111 mmol/L 107   CO2 22 - 32 mmol/L 20   Calcium 8.9 - 10.3 mg/dL 8.4   Total Protein 6.5 - 8.1 g/dL 6.1   Total Bilirubin 0.3 - 1.2 mg/dL 0.5   Alkaline Phos 38 - 126 U/L 106   AST 15 - 41 U/L 19   ALT 0 - 44 U/L 24    Edinburgh Score:     No data to display           After visit meds:  Allergies as of 02/16/2023   No Known Allergies      Medication List     TAKE these medications    enoxaparin 60 MG/0.6ML injection Commonly known as: LOVENOX Inject 0.5 mLs (50 mg total) into the skin daily for 42 doses. What changed:  medication strength how much to take when to take this   ibuprofen 600 MG tablet Commonly known as: ADVIL Take 1 tablet (600 mg total) by mouth every 6 (six) hours.   multivitamin-prenatal 27-0.8 MG Tabs tablet Take 1 tablet by mouth daily at 12 noon.   VITAMIN D PO Take by mouth.         Discharge home in stable condition Infant Feeding: {Baby feeding:23562} Infant Disposition:{CHL IP OB HOME WITH XLKGMW:10272} Discharge instruction: per After Visit Summary and Postpartum booklet. Activity: Advance as tolerated. Pelvic rest for 6 weeks.  Diet: {OB diet:21111121} Future  Appointments:No future appointments. Follow up Visit:  Follow-up Information     Ob/Gyn, [place]. Schedule an appointment as soon as possible for a visit in 6 week(s).   Specialty: Obstetrics and Gynecology Why: 6 week postpartum check. Contact information: [address] [place] [address] [phone]                  Please schedule this patient for a {Visit type:23955} postpartum visit in {Postpartum visit:23953} with the following provider: {Provider type:23954}. Additional Postpartum F/U:{PP Procedure:23957}  {Risk level:23960} pregnancy complicated by: {complication:23959} Delivery mode:  Vaginal, Spontaneous  Anticipated Birth Control:  {Birth  Control:23956}   02/16/2023 Prescilla Sours, MD

## 2023-02-16 NOTE — Discharge Instructions (Addendum)
Kathleen Mason, 1. Do not do any heavy lifting, i.e nothing heavier than 30 lbs for the next 6 weeks.  2.  Do not use tampons or douche or take baths, do not have any sexual intercourse or anything inside the vagina for the next 6 weeks.  3. Take your pain medication as needed for pain, let us know if the pain is not well controlled despite pain medication use.  4. Take your lovenox for prophylaxis.    5.  If you get a fever while at home, do check your temperature and if it is equal to or greater than 100.4 please call the office.   6. Some vaginal bleeding is expected and normal after your delivery. Please let us know if if it excessive where you saturate 1 pad in less than 2 hours or so.  7. Please let us know if with depression or anxiety symptoms.    [facility] [phone].

## 2023-02-24 ENCOUNTER — Inpatient Hospital Stay (HOSPITAL_COMMUNITY)
Admission: RE | Admit: 2023-02-24 | Payer: No Typology Code available for payment source | Source: Home / Self Care | Admitting: Obstetrics and Gynecology

## 2023-02-24 ENCOUNTER — Inpatient Hospital Stay (HOSPITAL_COMMUNITY): Payer: No Typology Code available for payment source

## 2023-03-04 ENCOUNTER — Telehealth (HOSPITAL_COMMUNITY): Payer: Self-pay

## 2023-03-04 NOTE — Telephone Encounter (Signed)
03/04/2023 1303  Name: Kathleen Mason MRN: [id number] DOB: December 13, 1985  Reason for Call:  Transition of Care Hospital Discharge Call  Contact Status: Patient Contact Status: Message  Language assistant needed: Interpreter Mode: Interpreter Not Needed        Follow-Up Questions:    Inocente Salles Postnatal Depression Scale:  In the Past 7 Days:    PHQ2-9 Depression Scale:     Discharge Follow-up:    Post-discharge interventions: NA  Signature  Signe Colt
